# Patient Record
Sex: Female | Born: 1945 | Race: White | Hispanic: No
Health system: Midwestern US, Academic
[De-identification: ages and names within clinical notes are randomized; demographics above are authoritative.]

---

## 2017-05-18 MED ORDER — REGADENOSON 0.4 MG/5 ML IV SYRG
.4 mg | Freq: Once | INTRAVENOUS | 0 refills | Status: CP
Start: 2017-05-18 — End: ?

## 2017-05-18 MED ORDER — AMINOPHYLLINE 500 MG/20 ML IV SOLN
50 mg | INTRAVENOUS | 0 refills | Status: AC | PRN
Start: 2017-05-18 — End: ?

## 2017-05-18 MED ORDER — ALBUTEROL SULFATE 90 MCG/ACTUATION IN HFAA
2 | RESPIRATORY_TRACT | 0 refills | Status: DC | PRN
Start: 2017-05-18 — End: 2017-05-23

## 2017-05-18 MED ORDER — SODIUM CHLORIDE 0.9 % IV SOLP
250 mL | INTRAVENOUS | 0 refills | Status: AC | PRN
Start: 2017-05-18 — End: ?

## 2017-05-18 MED ORDER — NITROGLYCERIN 0.4 MG SL SUBL
.4 mg | SUBLINGUAL | 0 refills | Status: AC | PRN
Start: 2017-05-18 — End: ?

## 2017-05-20 ENCOUNTER — Encounter: Admit: 2017-05-20 | Discharge: 2017-05-20 | Payer: MEDICARE

## 2017-05-20 NOTE — Telephone Encounter
-----   Message from Vanice Sarah, MD sent at 05/19/2017  3:55 PM CDT -----  [facility], can you please let Gianne know that this looks good for her?  Thanks.    Cc:  Dr. Burnadette Pop

## 2017-06-13 LAB — CREATINE KINASE-CPK: Lab: 122 — ABNORMAL LOW (ref 23–31)

## 2017-06-13 LAB — COMPREHENSIVE METABOLIC PANEL
Lab: 0.5
Lab: 1
Lab: 102 — ABNORMAL HIGH (ref 9.8–20.1)
Lab: 139
Lab: 151 — ABNORMAL HIGH (ref 83–110)
Lab: 21 — ABNORMAL HIGH (ref 0–14)
Lab: 3.3 — ABNORMAL LOW (ref 3.4–4.8)
Lab: 36 — ABNORMAL HIGH (ref 5–34)
Lab: 38
Lab: 53
Lab: 61
Lab: 68
Lab: 7.4 — ABNORMAL HIGH (ref 0–14)
Lab: 9.8

## 2017-06-13 LAB — TROPONIN-I

## 2017-06-13 LAB — MYOGLOBIN-CHEM: Lab: 100 — ABNORMAL HIGH (ref 9.8–20.1)

## 2017-06-14 LAB — THYROID STIMULATING HORMONE-TSH: Lab: 2.4

## 2017-07-01 ENCOUNTER — Encounter: Admit: 2017-07-01 | Discharge: 2017-07-01 | Payer: MEDICARE

## 2017-07-14 ENCOUNTER — Encounter: Admit: 2017-07-14 | Discharge: 2017-07-14 | Payer: MEDICARE

## 2017-07-14 ENCOUNTER — Ambulatory Visit: Admit: 2017-07-14 | Discharge: 2017-07-15 | Payer: MEDICARE

## 2017-07-14 DIAGNOSIS — M810 Age-related osteoporosis without current pathological fracture: ICD-10-CM

## 2017-07-14 DIAGNOSIS — E785 Hyperlipidemia, unspecified: ICD-10-CM

## 2017-07-14 DIAGNOSIS — D899 Disorder involving the immune mechanism, unspecified: ICD-10-CM

## 2017-07-14 DIAGNOSIS — K219 Gastro-esophageal reflux disease without esophagitis: ICD-10-CM

## 2017-07-14 DIAGNOSIS — I280 Arteriovenous fistula of pulmonary vessels: ICD-10-CM

## 2017-07-14 DIAGNOSIS — M069 Rheumatoid arthritis, unspecified: ICD-10-CM

## 2017-07-14 DIAGNOSIS — M1990 Unspecified osteoarthritis, unspecified site: ICD-10-CM

## 2017-07-14 DIAGNOSIS — E119 Type 2 diabetes mellitus without complications: ICD-10-CM

## 2017-07-14 DIAGNOSIS — G5600 Carpal tunnel syndrome, unspecified upper limb: ICD-10-CM

## 2017-07-14 DIAGNOSIS — I480 Paroxysmal atrial fibrillation: Principal | ICD-10-CM

## 2017-07-14 DIAGNOSIS — Z01810 Encounter for preprocedural cardiovascular examination: ICD-10-CM

## 2017-07-14 DIAGNOSIS — IMO0002 Ulcer: ICD-10-CM

## 2017-07-14 MED ORDER — PROPRANOLOL 40 MG PO TAB
ORAL_TABLET | 2 refills | Status: AC
Start: 2017-07-14 — End: 2018-11-23

## 2017-07-14 NOTE — Assessment & Plan Note
She is not particularly interested in taking a daily medication to try to prevent episodes of atrial fibrillation, which seems reasonable given the infrequency of her episodes.  I gave her a prescription for propranolol to use on a as needed basis because she does seem to recognize the atrial fibrillation when she has.  She discontinued oral anticoagulation and has had no bleeding complications.

## 2017-07-14 NOTE — Progress Notes
Date of Service: 07/14/2017    Madison Johnston is a 71 y.o. female.       HPI     Madison Johnston was in the [place] office today for follow-up regarding paroxysmal atrial fibrillation.  Although the symptoms she has while in atrial fibrillation are atypical she does seem to be able to reliably determine when she is in the dysrhythmia.    She went to the [facility] emergency department about a month ago with an episode of atrial fibrillation and was monitored overnight.  She had presented with a sense of her heart beating fast that was confirmed with her home blood pressure monitor.  They gave her IV diltiazem in the emergency department and she converted spontaneously to sinus rhythm shortly afterwards.    She never feels particularly bad while in atrial fibrillation.  She describes the symptoms as feeling different in my head or tingly.  She does seem to be able to tell reliably that she is out of rhythm and her home blood pressure monitor confirms a fast heart rate when this happens.    She has had no bleeding complications related to oral anticoagulation.  She denies any chest discomfort or breathlessness.           Vitals:    07/14/17 0856   BP: 128/76   Pulse: 70   Height: 1.524 m (5')     There is no height or weight on file to calculate BMI.     Past Medical History  Patient Active Problem List    Diagnosis Date Noted   ??? Gout flare 09/01/2011     Priority: High   ??? Screening for cardiovascular condition 01/14/2017   ??? Paroxysmal A-fib [facility]) 11/07/2016     11/01/16 - AF presentation to [facility] ED.  Converted spontaneously to SR on IV diltiazem.  Eliquis started.  11/02/16 - Echo [facility]):  EF 65%.  Mildly thickened mitral leaflets.  Mild MAC.  Mean AV gradient 8 mmHg.  Otherwise normal study.     ??? HTN (hypertension) 07/08/2015   ??? DM (diabetes mellitus screen) 07/08/2015   ??? 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 Status post right knee replacement 07/22/2014 ??? Pre-operative cardiovascular examination 07/19/2014   ??? 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 Fracture of lateral condyle of femur (HCC) 08/22/2013   ??? Personal history of colonic polyps 07/12/2012     Colonoscopy 03/2012: TI normal. Rectal stump normal. Mild diverticulosis in the descending colon. Cecal polyp (TA) removed. Three sessile polyps in the descending colon removed (TAs) .      ??? Diverticulosis of colon 09/21/2011   ??? Diverticulitis of colon with perforation 09/21/2011     S/P Sigmoid colectomy and Hartman's in 07/2011.       ??? Postoperative wound infection 09/21/2011   ??? S/P colon resection 09/21/2011   ??? Morbid obesity (HCC) 08/17/2011   ??? Urinary retention 08/17/2011   ??? Anxiety 08/17/2011   ??? Hypotension 08/17/2011   ??? Hypoalbuminemia 08/16/2011   ??? Hypocalcemia 08/16/2011   ??? Gait abnormality 08/13/2011   ??? Debility 08/13/2011   ??? Anemia 08/13/2011   ??? Clostridium difficile infection 08/13/2011   ??? Post-operative pain 08/13/2011   ??? Sacral pressure ulcer 08/13/2011   ??? Hypokalemia 08/13/2011   ??? AKI (acute kidney injury) (HCC) 07/27/2011   ??? Hypoxia 07/27/2011   ??? Constipation 07/14/2011     Change in bowel habits  and new constipation since Feb 2012. Two admissions to [facility] in 02 and 03/12 for colitis found on CT ad and treated with ABx. Admitted to [facility] for large bowel obstruction in 07/2011, had surgery for sigmoid inflammatory mass     ??? Rheumatoid arthritis 07/14/2011   ??? GERD (gastroesophageal reflux disease) 07/14/2011     Currently controlled     ??? Osteoporosis 07/14/2011         Review of Systems   Constitution: Negative.   HENT: Negative.    Eyes: Negative.    Cardiovascular: Positive for dyspnea on exertion.   Respiratory: Positive for snoring.    Endocrine: Negative.    Hematologic/Lymphatic: Negative.    Skin: Positive for dry skin.   Musculoskeletal: Positive for arthritis.   Gastrointestinal: Negative. Genitourinary: Negative.    Neurological: Negative.    Psychiatric/Behavioral: Negative.    Allergic/Immunologic: Positive for environmental allergies.       Physical Exam    Physical Exam   General Appearance: no distress   Skin: warm, no ulcers or xanthomas   Digits and Nails: no cyanosis or clubbing   Eyes: conjunctivae and lids normal, pupils are equal and round   Teeth/Gums/Palate: dentition unremarkable, no lesions   Lips & Oral Mucosa: no pallor or cyanosis   Neck Veins: normal JVP , neck veins are not distended   Thyroid: no nodules, masses, tenderness or enlargement   Chest Inspection: chest is normal in appearance   Respiratory Effort: breathing comfortably, no respiratory distress   Auscultation/Percussion: lungs clear to auscultation, no rales or rhonchi, no wheezing   PMI: PMI not enlarged or displaced   Cardiac Rhythm: regular rhythm and normal rate   Cardiac Auscultation: S1, S2 normal, no rub, no gallop   Murmurs: no murmur   Peripheral Circulation: normal peripheral circulation   Carotid Arteries: normal carotid upstroke bilaterally, no bruits   Radial Arteries: normal symmetric radial pulses   Abdominal Aorta: no abdominal aortic bruit   Pedal Pulses: normal symmetric pedal pulses   Lower Extremity Edema: no lower extremity edema   Abdominal Exam: soft, non-tender, no masses, bowel sounds normal   Liver & Spleen: no organomegaly   Gait & Station: walks with a walker  Muscle Strength: normal muscle tone   Orientation: oriented to time, place and person   Affect & Mood: appropriate and sustained affect   Language and Memory: patient responsive and seems to comprehend information   Neurologic Exam: neurological assessment grossly intact   Other: moves all extremities      Problems Addressed Today  Encounter Diagnoses   Name Primary?   ??? Paroxysmal A-fib (HCC)    ??? Pre-operative cardiovascular examination        Assessment and Plan       Paroxysmal A-fib (HCC) She is not particularly interested in taking a daily medication to try to prevent episodes of atrial fibrillation, which seems reasonable given the infrequency of her episodes.  I gave her a prescription for propranolol to use on a as needed basis because she does seem to recognize the atrial fibrillation when she has.  She discontinued oral anticoagulation and has had no bleeding complications.    Pre-operative cardiovascular examination  Her stress test in late May showed normal LV systolic function and no evidence of cardiac ischemia.      Current Medications (including today's revisions)  ??? allopurinol (ZYLOPRIM) 100 mg tablet Take 1 Tab by mouth daily. (Patient taking differently: Take 100 mg by mouth  at bedtime daily.)   ??? apixaban (ELIQUIS) 5 mg tablet Take 5 mg by mouth twice daily.   ??? calcium carbonate (OS-CAL) 1250 mg tablet Take 1,250 mg by mouth twice daily.   ??? cephalexin (KEFLEX) 500 mg capsule Take 4 capsules by mouth one hour before dental procedure. Use remaining capsules for future dental procedures.   ??? cetirizine (ZYRTEC) 10 mg tablet Take 1 Tab by mouth daily as needed for Allergy symptoms.   ??? cholecalciferol (VITAMIN D-3) 1,000 units tablet Take 2,000 Units by mouth daily.   ??? esomeprazole DR(+) (NEXIUM) 40 mg capsule Take 40 mg by mouth every morning.   ??? fish oil /omega-3 fatty acids (SEA-OMEGA) 340/1000 mg capsule Take 2 Caps by mouth daily.   ??? fluticasone (FLONASE) 50 mcg/actuation nasal spray Apply 2 Sprays to each nostril as directed daily as needed.   ??? folic acid (FOLVITE) 1 mg tablet Take 1 Tab by mouth daily. (Patient taking differently: Take 2 mg by mouth daily.)   ??? leflunomide (ARAVA) 20 mg tablet Take 20 mg by mouth daily.   ??? losartan (COZAAR) 50 mg tablet Take 1 tablet by mouth daily.   ??? metFORMIN (GLUCOPHAGE) 500 mg tablet Take 1 Tab by mouth twice daily with meals. (Patient taking differently: Take 500 mg by mouth daily with breakfast.) ??? metoprolol XL (TOPROL XL) 50 mg tablet Take 1 Tab by mouth twice daily. (Patient taking differently: Take 100 mg by mouth daily.)   ??? predniSONE (DELTASONE) 2.5 mg tablet Take 1 Tab by mouth daily.   ??? propranolol (INDERAL) 40 mg tablet Take 1 for atrial fibrillation; may repeat in 15 minutes if necessary   ??? senna (SENOKOT) 8.6 mg tablet Take 3 tablets by mouth at bedtime daily.   ??? simvastatin (ZOCOR) 80 mg tablet Take 1 Tab by mouth at bedtime daily.   ??? triamterene-hydrochlorothiazide (MAXZIDE) 75-50 mg tablet Take 1 tablet by mouth daily.   ??? vitamins, multiple tablet Take 1 Tab by mouth daily.

## 2017-07-14 NOTE — Assessment & Plan Note
Her stress test in late May showed normal LV systolic function and no evidence of cardiac ischemia.

## 2017-07-19 ENCOUNTER — Encounter: Admit: 2017-07-19 | Discharge: 2017-07-19 | Payer: MEDICARE

## 2018-01-31 ENCOUNTER — Encounter: Admit: 2018-01-31 | Discharge: 2018-01-31 | Payer: MEDICARE

## 2018-01-31 MED ORDER — LOSARTAN 50 MG PO TAB
ORAL_TABLET | Freq: Every day | ORAL | 3 refills | 90.00000 days | Status: AC
Start: 2018-01-31 — End: 2019-02-06

## 2018-02-27 ENCOUNTER — Encounter: Admit: 2018-02-27 | Discharge: 2018-02-27 | Payer: MEDICARE

## 2018-03-23 ENCOUNTER — Encounter: Admit: 2018-03-23 | Discharge: 2018-03-23 | Payer: MEDICARE

## 2018-03-23 DIAGNOSIS — E785 Hyperlipidemia, unspecified: ICD-10-CM

## 2018-03-23 DIAGNOSIS — M1990 Unspecified osteoarthritis, unspecified site: Secondary | ICD-10-CM

## 2018-03-23 DIAGNOSIS — IMO0002 Ulcer: ICD-10-CM

## 2018-03-23 DIAGNOSIS — M069 Rheumatoid arthritis, unspecified: ICD-10-CM

## 2018-03-23 DIAGNOSIS — I280 Arteriovenous fistula of pulmonary vessels: ICD-10-CM

## 2018-03-23 DIAGNOSIS — M810 Age-related osteoporosis without current pathological fracture: ICD-10-CM

## 2018-03-23 DIAGNOSIS — K219 Gastro-esophageal reflux disease without esophagitis: ICD-10-CM

## 2018-03-23 DIAGNOSIS — G5600 Carpal tunnel syndrome, unspecified upper limb: ICD-10-CM

## 2018-03-23 DIAGNOSIS — E119 Type 2 diabetes mellitus without complications: ICD-10-CM

## 2018-03-23 DIAGNOSIS — D899 Disorder involving the immune mechanism, unspecified: ICD-10-CM

## 2018-07-31 LAB — HEMOGLOBIN A1C: Lab: 6.1

## 2018-07-31 LAB — COMPREHENSIVE METABOLIC PANEL: Lab: 139

## 2018-07-31 LAB — LIPID PROFILE
Lab: 179
Lab: 425 — ABNORMAL HIGH (ref 30–200)

## 2018-07-31 LAB — CBC: Lab: 7.7

## 2018-08-10 ENCOUNTER — Encounter: Admit: 2018-08-10 | Discharge: 2018-08-10 | Payer: MEDICARE

## 2018-08-18 ENCOUNTER — Encounter: Admit: 2018-08-18 | Discharge: 2018-08-18 | Payer: MEDICARE

## 2018-08-18 DIAGNOSIS — K219 Gastro-esophageal reflux disease without esophagitis: ICD-10-CM

## 2018-08-18 DIAGNOSIS — M810 Age-related osteoporosis without current pathological fracture: ICD-10-CM

## 2018-08-18 DIAGNOSIS — IMO0002 Ulcer: ICD-10-CM

## 2018-08-18 DIAGNOSIS — D899 Disorder involving the immune mechanism, unspecified: ICD-10-CM

## 2018-08-18 DIAGNOSIS — I280 Arteriovenous fistula of pulmonary vessels: ICD-10-CM

## 2018-08-18 DIAGNOSIS — G5600 Carpal tunnel syndrome, unspecified upper limb: ICD-10-CM

## 2018-08-18 DIAGNOSIS — K435 Parastomal hernia without obstruction or  gangrene: ICD-10-CM

## 2018-08-18 DIAGNOSIS — M069 Rheumatoid arthritis, unspecified: ICD-10-CM

## 2018-08-18 DIAGNOSIS — E785 Hyperlipidemia, unspecified: ICD-10-CM

## 2018-08-18 DIAGNOSIS — R109 Unspecified abdominal pain: Principal | ICD-10-CM

## 2018-08-18 DIAGNOSIS — M1990 Unspecified osteoarthritis, unspecified site: Secondary | ICD-10-CM

## 2018-08-18 DIAGNOSIS — I480 Paroxysmal atrial fibrillation: Secondary | ICD-10-CM

## 2018-08-18 DIAGNOSIS — E119 Type 2 diabetes mellitus without complications: ICD-10-CM

## 2018-08-18 DIAGNOSIS — K5730 Diverticulosis of large intestine without perforation or abscess without bleeding: ICD-10-CM

## 2018-08-29 ENCOUNTER — Encounter: Admit: 2018-08-29 | Discharge: 2018-08-30 | Payer: MEDICARE

## 2018-09-08 ENCOUNTER — Encounter: Admit: 2018-09-08 | Discharge: 2018-09-08 | Payer: MEDICARE

## 2018-09-08 DIAGNOSIS — I480 Paroxysmal atrial fibrillation: Principal | ICD-10-CM

## 2018-09-08 DIAGNOSIS — G5600 Carpal tunnel syndrome, unspecified upper limb: ICD-10-CM

## 2018-09-08 DIAGNOSIS — M810 Age-related osteoporosis without current pathological fracture: ICD-10-CM

## 2018-09-08 DIAGNOSIS — E785 Hyperlipidemia, unspecified: ICD-10-CM

## 2018-09-08 DIAGNOSIS — M069 Rheumatoid arthritis, unspecified: ICD-10-CM

## 2018-09-08 DIAGNOSIS — IMO0002 Ulcer: ICD-10-CM

## 2018-09-08 DIAGNOSIS — E119 Type 2 diabetes mellitus without complications: ICD-10-CM

## 2018-09-08 DIAGNOSIS — K435 Parastomal hernia without obstruction or  gangrene: ICD-10-CM

## 2018-09-08 DIAGNOSIS — D899 Disorder involving the immune mechanism, unspecified: ICD-10-CM

## 2018-09-08 DIAGNOSIS — M1990 Unspecified osteoarthritis, unspecified site: Secondary | ICD-10-CM

## 2018-09-08 DIAGNOSIS — I280 Arteriovenous fistula of pulmonary vessels: ICD-10-CM

## 2018-09-08 DIAGNOSIS — K219 Gastro-esophageal reflux disease without esophagitis: ICD-10-CM

## 2018-09-10 ENCOUNTER — Encounter: Admit: 2018-09-10 | Discharge: 2018-09-10 | Payer: MEDICARE

## 2018-11-07 ENCOUNTER — Encounter: Admit: 2018-11-07 | Discharge: 2018-11-07 | Payer: MEDICARE

## 2018-11-23 ENCOUNTER — Ambulatory Visit: Admit: 2018-11-23 | Discharge: 2018-11-24 | Payer: MEDICARE

## 2018-11-23 ENCOUNTER — Encounter: Admit: 2018-11-23 | Discharge: 2018-11-23 | Payer: MEDICARE

## 2018-11-23 DIAGNOSIS — D899 Disorder involving the immune mechanism, unspecified: ICD-10-CM

## 2018-11-23 DIAGNOSIS — E119 Type 2 diabetes mellitus without complications: ICD-10-CM

## 2018-11-23 DIAGNOSIS — M1990 Unspecified osteoarthritis, unspecified site: Secondary | ICD-10-CM

## 2018-11-23 DIAGNOSIS — E785 Hyperlipidemia, unspecified: ICD-10-CM

## 2018-11-23 DIAGNOSIS — M069 Rheumatoid arthritis, unspecified: ICD-10-CM

## 2018-11-23 DIAGNOSIS — I280 Arteriovenous fistula of pulmonary vessels: ICD-10-CM

## 2018-11-23 DIAGNOSIS — IMO0002 Ulcer: ICD-10-CM

## 2018-11-23 DIAGNOSIS — I480 Paroxysmal atrial fibrillation: Principal | ICD-10-CM

## 2018-11-23 DIAGNOSIS — K219 Gastro-esophageal reflux disease without esophagitis: ICD-10-CM

## 2018-11-23 DIAGNOSIS — G5600 Carpal tunnel syndrome, unspecified upper limb: ICD-10-CM

## 2018-11-23 DIAGNOSIS — I10 Essential (primary) hypertension: ICD-10-CM

## 2018-11-23 DIAGNOSIS — M810 Age-related osteoporosis without current pathological fracture: ICD-10-CM

## 2018-11-23 MED ORDER — PROPRANOLOL 40 MG PO TAB
ORAL_TABLET | 3 refills | Status: AC
Start: 2018-11-23 — End: 2019-12-18

## 2019-02-06 ENCOUNTER — Encounter: Admit: 2019-02-06 | Discharge: 2019-02-06 | Payer: MEDICARE

## 2019-02-06 MED ORDER — LOSARTAN 50 MG PO TAB
ORAL_TABLET | Freq: Every day | ORAL | 3 refills | 30.00000 days | Status: AC
Start: 2019-02-06 — End: 2019-11-20

## 2019-05-24 ENCOUNTER — Encounter: Admit: 2019-05-24 | Discharge: 2019-05-24

## 2019-05-24 NOTE — Telephone Encounter
05/24/2019 1:31 PM I called Nash Dimmer back at [phone] and LM that Dr Barry Dienes was fine with Dr greens starting Okey Regal on HCTZ 12.13mh daily.  Med list updated. Morene Crocker, RN        Vanice Sarah, MD  You 2 hours ago (10:48 AM)      Sure, thanks.    Routing comment

## 2019-05-24 NOTE — Telephone Encounter
05/24/2019 10:42 AM   I received a call from Dr Thomasene Lot nurse Nash Dimmer at the [place] clinic.  Dr Chilton Si saw Madison Johnston today and she had some pedal edema, her BP was 136/78.  Dr Chilton Si would like to know if Dr Orvan July would be OK with adding HCTZ 12.5mg  daily. Morene Crocker, RN

## 2019-11-20 ENCOUNTER — Encounter: Admit: 2019-11-20 | Discharge: 2019-11-20 | Payer: MEDICARE

## 2019-11-20 MED ORDER — LOSARTAN 50 MG PO TAB
50 mg | ORAL_TABLET | Freq: Every day | ORAL | 3 refills | 30.00000 days | Status: DC
Start: 2019-11-20 — End: 2020-01-10

## 2019-11-28 ENCOUNTER — Ambulatory Visit: Admit: 2019-11-28 | Discharge: 2019-11-28 | Payer: MEDICARE

## 2019-11-28 ENCOUNTER — Encounter: Admit: 2019-11-28 | Discharge: 2019-11-28 | Payer: MEDICARE

## 2019-11-28 DIAGNOSIS — I480 Paroxysmal atrial fibrillation: Secondary | ICD-10-CM

## 2019-11-28 DIAGNOSIS — M1990 Unspecified osteoarthritis, unspecified site: Secondary | ICD-10-CM

## 2019-11-28 DIAGNOSIS — I10 Essential (primary) hypertension: Secondary | ICD-10-CM

## 2019-11-28 DIAGNOSIS — M810 Age-related osteoporosis without current pathological fracture: Secondary | ICD-10-CM

## 2019-11-28 DIAGNOSIS — D899 Disorder involving the immune mechanism, unspecified: Secondary | ICD-10-CM

## 2019-11-28 DIAGNOSIS — E785 Hyperlipidemia, unspecified: Secondary | ICD-10-CM

## 2019-11-28 DIAGNOSIS — M069 Rheumatoid arthritis, unspecified: Secondary | ICD-10-CM

## 2019-11-28 DIAGNOSIS — G5600 Carpal tunnel syndrome, unspecified upper limb: Secondary | ICD-10-CM

## 2019-11-28 DIAGNOSIS — K219 Gastro-esophageal reflux disease without esophagitis: Secondary | ICD-10-CM

## 2019-11-28 DIAGNOSIS — G4733 Obstructive sleep apnea (adult) (pediatric): Secondary | ICD-10-CM

## 2019-11-28 DIAGNOSIS — IMO0002 Ulcer: Secondary | ICD-10-CM

## 2019-11-28 DIAGNOSIS — E119 Type 2 diabetes mellitus without complications: Secondary | ICD-10-CM

## 2019-11-28 DIAGNOSIS — I280 Arteriovenous fistula of pulmonary vessels: Secondary | ICD-10-CM

## 2019-11-28 DIAGNOSIS — I5031 Acute diastolic (congestive) heart failure: Secondary | ICD-10-CM

## 2019-11-28 NOTE — Assessment & Plan Note
I asked her to check her blood pressure at home regularly.  The goal is an average of 130/80 or less.

## 2019-11-28 NOTE — Assessment & Plan Note
I plan to arrange for a 1 week Zio patch monitor to be applied in our [place] clinic.  Part of managing her diastolic heart failure exacerbation will involve assessment of her atrial fibrillation burden.

## 2019-11-28 NOTE — Progress Notes
Date of Service: 11/28/2019    Madison Johnston is a 73 y.o. female.       HPI     Madison Johnston was in the [place] office today with her daughter.  She is here for an urgent work in visit regarding lower extremity edema.    This is a new problem for her.  She does have a history of paroxysmal atrial fibrillation over the past couple of years and admits that she no longer thinks she can tell when she is out of rhythm.  She says that Dr. Chilton Si thought she was in atrial fibrillation when she was in the office recently but she otherwise has no idea about the frequency of dysrhythmia.  Madison Johnston admits that she was not taking her Eliquis regularly because of cost but over the past month she has been taking it on a regular basis.    She has pretty massive lower extremity edema and thinks that furosemide was prescribed along with potassium but does not think it made much of a difference.  Madison Johnston has not been particularly short of breath but it is more difficult for her to walk because of the degree of edema in her legs.  She denies any problems with syncope or near syncope nor has she been having any chest discomfort.    Her daughter indicates that the patient has terrible episodes of apnea and that she thinks she needs to be evaluated for sleep disorder.         Vitals:    11/28/19 1620   BP: (!) 140/90   BP Source: Arm, Left Upper   Pulse: 79   SpO2: 96%   Weight: 94.3 kg (208 lb)   Height: 1.524 m (5')   PainSc: Zero     Body mass index is 40.62 kg/m?Marland Kitchen     Past Medical History  Patient Active Problem List    Diagnosis Date Noted   ? Gout flare 09/01/2011     Priority: High   ? OSA (obstructive sleep apnea) 11/28/2019   ? Acute heart failure with preserved ejection fraction (HCC) 11/28/2019   ? 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 Screening for cardiovascular condition 01/14/2017   ? Paroxysmal A-fib [facility]) 11/07/2016 11/01/16 - AF presentation to [facility] ED.  Converted spontaneously to SR on IV diltiazem.  Eliquis started.  11/02/16 - Echo [facility]):  EF 65%.  Mildly thickened mitral leaflets.  Mild MAC.  Mean AV gradient 8 mmHg.  Otherwise normal study.     ? HTN (hypertension) 07/08/2015   ? DM (diabetes mellitus screen) 07/08/2015   ? 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 Pre-operative cardiovascular examination 07/19/2014   ? 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 Fracture of lateral condyle of femur (HCC) 08/22/2013   ? Personal history of colonic polyps 07/12/2012     Colonoscopy 03/2012: TI normal. Rectal stump normal. Mild diverticulosis in the descending colon. Cecal polyp (TA) removed. Three sessile polyps in the descending colon removed (TAs) .      ? Diverticulosis of colon 09/21/2011   ? Diverticulitis of colon with perforation 09/21/2011     S/P Sigmoid colectomy and Hartman's in 07/2011.       ? Postoperative wound infection 09/21/2011   ? S/P colon resection 09/21/2011   ? Morbid obesity (HCC) 08/17/2011   ? Urinary retention 08/17/2011   ?  Anxiety 08/17/2011   ? Hypotension 08/17/2011   ? Hypoalbuminemia 08/16/2011   ? Hypocalcemia 08/16/2011   ? Gait abnormality 08/13/2011   ? Debility 08/13/2011   ? Anemia 08/13/2011   ? Clostridium difficile infection 08/13/2011   ? Post-operative pain 08/13/2011   ? Sacral pressure ulcer 08/13/2011   ? Hypokalemia 08/13/2011   ? AKI (acute kidney injury) (HCC) 07/27/2011   ? Hypoxia 07/27/2011   ? Constipation 07/14/2011     Change in bowel habits and new constipation since Feb 2012. Two admissions to [facility] in 02 and 03/12 for colitis found on CT ad and treated with ABx. Admitted to [facility] for large bowel obstruction in 07/2011, had surgery for sigmoid inflammatory mass     ? Rheumatoid arthritis 07/14/2011 ? GERD (gastroesophageal reflux disease) 07/14/2011     Currently controlled     ? Osteoporosis 07/14/2011         Review of Systems   Constitution: Positive for malaise/fatigue.   HENT: Negative.    Eyes: Negative.    Cardiovascular: Positive for leg swelling (BOTH ).   Respiratory: Negative.    Endocrine: Negative.    Skin: Negative.    Musculoskeletal: Negative.    Gastrointestinal: Negative.    Genitourinary: Negative.    Neurological: Negative.    Psychiatric/Behavioral: Negative.        Physical Exam    Physical Exam   General Appearance: no distress   Skin: warm, no ulcers or xanthomas   Digits and Nails: no cyanosis or clubbing   Eyes: conjunctivae and lids normal, pupils are equal and round   Teeth/Gums/Palate: dentition unremarkable, no lesions   Lips & Oral Mucosa: no pallor or cyanosis   Neck Veins: normal JVP , neck veins are not distended   Thyroid: no nodules, masses, tenderness or enlargement   Chest Inspection: chest is normal in appearance   Respiratory Effort: breathing comfortably, no respiratory distress   Auscultation/Percussion: lungs clear to auscultation, no rales or rhonchi, no wheezing   PMI: PMI not enlarged or displaced   Cardiac Rhythm: regular rhythm and normal rate   Cardiac Auscultation: S1, S2 normal, no rub, no gallop   Murmurs: no murmur   Peripheral Circulation: normal peripheral circulation   Carotid Arteries: normal carotid upstroke bilaterally, no bruits   Radial Arteries: normal symmetric radial pulses   Abdominal Aorta: no abdominal aortic bruit   Pedal Pulses: normal symmetric pedal pulses   Lower Extremity Edema: 3+ bilateral lower extremity edema   Abdominal Exam: soft, non-tender, no masses, bowel sounds normal   Liver & Spleen: no organomegaly   Gait & Station: in a wheelchair  Muscle Strength: normal muscle tone   Orientation: oriented to time, place and person   Affect & Mood: appropriate and sustained affect Language and Memory: patient responsive and seems to comprehend information   Neurologic Exam: neurological assessment grossly intact   Other: moves all extremities        Problems Addressed Today  Encounter Diagnoses   Name Primary?   ? Paroxysmal A-fib (HCC)    ? Essential hypertension    ? OSA (obstructive sleep apnea)    ? Acute heart failure with preserved ejection fraction (HCC)        Assessment and Plan       Paroxysmal A-fib (HCC)  I plan to arrange for a 1 week Zio patch monitor to be applied in our [place] clinic.  Part of managing her diastolic heart failure exacerbation will  involve assessment of her atrial fibrillation burden.    HTN (hypertension)  I asked her to check her blood pressure at home regularly.  The goal is an average of 130/80 or less.    OSA (obstructive sleep apnea)  She has an old diagnosis of sleep apnea but has not used her CPAP machine for about 9 years.  Her daughter says that she has terrible apnea and certainly this would be contributing to both atrial fibrillation and heart failure.  I would like to get her scheduled for a sleep study at [facility].    Acute heart failure with preserved ejection fraction [facility])  It looks to me like she is having an acute exacerbation of diastolic heart failure.  I like to make arrangements for her to get IV furosemide at [facility].  We will look for the lab work that Dr. Chilton Si had done a couple of weeks ago.  We'll schedule an echocardiogram at [facility].  She will probably need to be on a diuretic chronically.  We do need to see whether sleep apnea and increased AF burden may be contributing to her propensity for diastolic heart failure.      Current Medications (including today's revisions)  ? allopurinol (ZYLOPRIM) 100 mg tablet Take 1 Tab by mouth daily. (Patient taking differently: Take 100 mg by mouth at bedtime daily.)   ? apixaban (ELIQUIS) 5 mg tablet Take 5 mg by mouth twice daily. ? calcium carbonate (OS-CAL) 1250 mg tablet Take 1,250 mg by mouth twice daily.   ? cephalexin (KEFLEX) 500 mg capsule Take 4 capsules by mouth one hour before dental procedure. Use remaining capsules for future dental procedures.   ? cetirizine (ZYRTEC) 10 mg tablet Take 1 Tab by mouth daily as needed for Allergy symptoms.   ? cholecalciferol (VITAMIN D-3) 1,000 units tablet Take 2,000 Units by mouth daily.   ? esomeprazole DR(+) (NEXIUM) 40 mg capsule Take 40 mg by mouth every morning.   ? fish oil /omega-3 fatty acids (SEA-OMEGA) 340/1000 mg capsule Take 2 Caps by mouth daily.   ? fluticasone (FLONASE) 50 mcg/actuation nasal spray Apply 2 Sprays to each nostril as directed daily as needed.   ? folic acid (FOLVITE) 1 mg tablet Take 1 Tab by mouth daily. (Patient taking differently: Take 2 mg by mouth daily.)   ? hydroCHLOROthiazide (HYDRODIURIL) 12.5 mg tablet Take 12.5 mg by mouth every morning.   ? leflunomide (ARAVA) 20 mg tablet Take 20 mg by mouth daily.   ? losartan (COZAAR) 50 mg tablet Take one tablet by mouth daily.   ? metFORMIN (GLUCOPHAGE) 500 mg tablet Take 1 Tab by mouth twice daily with meals. (Patient taking differently: Take 500 mg by mouth daily with breakfast.)   ? metoprolol XL (TOPROL XL) 50 mg tablet Take 1 Tab by mouth twice daily. (Patient taking differently: Take 100 mg by mouth daily.)   ? propranolol (INDERAL) 40 mg tablet Take 1 for atrial fibrillation; may repeat in 15 minutes if necessary   ? senna (SENOKOT) 8.6 mg tablet Take 3 tablets by mouth at bedtime daily.   ? simvastatin (ZOCOR) 80 mg tablet Take 1 Tab by mouth at bedtime daily.   ? triamterene-hydrochlorothiazide (MAXZIDE) 75-50 mg tablet Take 1 tablet by mouth daily.   ? vitamins, multiple tablet Take 1 Tab by mouth daily.

## 2019-11-28 NOTE — Assessment & Plan Note
She has an old diagnosis of sleep apnea but has not used her CPAP machine for about 9 years.  Her daughter says that she has terrible apnea and certainly this would be contributing to both atrial fibrillation and heart failure.  I would like to get her scheduled for a sleep study at [facility].

## 2019-11-29 ENCOUNTER — Encounter: Admit: 2019-11-29 | Discharge: 2019-11-29 | Payer: MEDICARE

## 2019-11-29 MED ORDER — FUROSEMIDE IVPB
40 mg | INTRAVENOUS | 0 refills | 90.00000 days | Status: DC
Start: 2019-11-29 — End: 2019-11-30

## 2019-11-30 ENCOUNTER — Encounter: Admit: 2019-11-30 | Discharge: 2019-11-30 | Payer: MEDICARE

## 2019-11-30 DIAGNOSIS — I5031 Acute diastolic (congestive) heart failure: Secondary | ICD-10-CM

## 2019-11-30 LAB — BASIC METABOLIC PANEL
Lab: 1
Lab: 140
Lab: 16 — ABNORMAL HIGH (ref 0–14)
Lab: 24
Lab: 3.8

## 2019-11-30 MED ORDER — FUROSEMIDE 10 MG/ML IJ SOLN
40 mg | Freq: Once | INTRAVENOUS | 2 refills | 90.00000 days | Status: AC
Start: 2019-11-30 — End: ?

## 2019-12-07 ENCOUNTER — Encounter: Admit: 2019-12-07 | Discharge: 2019-12-07 | Payer: MEDICARE

## 2019-12-07 DIAGNOSIS — I5031 Acute diastolic (congestive) heart failure: Secondary | ICD-10-CM

## 2019-12-07 DIAGNOSIS — I4891 Unspecified atrial fibrillation: Secondary | ICD-10-CM

## 2019-12-07 DIAGNOSIS — I480 Paroxysmal atrial fibrillation: Secondary | ICD-10-CM

## 2019-12-07 NOTE — Telephone Encounter
Called and discussed results with patient.  No questions at this time.  Pt will callback with any questions, concerns or problems.  Pt will have labs rechecked on Monday at [facility]  Lab order faxed.    Pt states that she hasn't noticed any significant improvement of her symptoms after having her lasix infusions.  She states that she is not quite as short of breath, but states that she hasn't really been doing anything to make that worse.  She states that her lower extremities are still swollen and her weight has not changed.  She is taking her medications as prescribed.      Will route to Dr. Ricard Dillon for any additional recommendations.

## 2019-12-07 NOTE — Telephone Encounter
-----   Message from Michiel Cowboy, MD sent at 12/03/2019  4:38 PM CST -----  Maybe just BMP next Monday at this point, thanks.  ----- Message -----  From: Baldwin Crown, RN  Sent: 12/03/2019   3:16 PM CST  To: Michiel Cowboy, MD    Dr. Ricard Dillon,    Richardson Landry and Threasa Beards scheduled Arbie Cookey for additional lasix infusions, she had one on Friday, and is scheduled for the final dose today.  Do you want to get any additional labs?  Please let me know and we will get them set up.    Thanks for your help!Roselyn Reef, RN  ----- Message -----  From: Katina Degree, RN  Sent: 12/03/2019   3:13 PM CST  To: Baldwin Crown, RN    Patient was scheduled for lasix infusion on Friday and today, 12/03/19.   ----- Message -----  From: Baldwin Crown, RN  Sent: 12/03/2019   2:42 PM CST  To: Katina Degree, RN, Asencion Noble    Can one of you respond back to Dr. Ricard Dillon on when she is having additional lasix infusions?  The note says Lasix 40x2 over the next week.  I'm not sure when/if these are scheduled.    Thanks for your help!  ----- Message -----  From: Michiel Cowboy, MD  Sent: 12/03/2019   2:28 PM CST  To: Baldwin Crown, RN    Looks OK.  Does she need additional infusions?  ----- Message -----  From: Baldwin Crown, RN  Sent: 11/30/2019   3:48 PM CST  To: Michiel Cowboy, MD    Labs for your review, pt had Lasix infusion today in [place].  Please let myself or the [place] RNs know if you have any additional recommendations. Thanks!

## 2019-12-10 ENCOUNTER — Encounter: Admit: 2019-12-10 | Discharge: 2019-12-10 | Payer: MEDICARE

## 2019-12-10 DIAGNOSIS — I480 Paroxysmal atrial fibrillation: Secondary | ICD-10-CM

## 2019-12-10 DIAGNOSIS — I4891 Unspecified atrial fibrillation: Secondary | ICD-10-CM

## 2019-12-10 DIAGNOSIS — I5031 Acute diastolic (congestive) heart failure: Secondary | ICD-10-CM

## 2019-12-10 LAB — BASIC METABOLIC PANEL
Lab: 1
Lab: 102
Lab: 120 — ABNORMAL HIGH (ref 70–105)
Lab: 139
Lab: 22 — ABNORMAL HIGH (ref 9.8–20.1)
Lab: 24
Lab: 3.7
Lab: 8.6

## 2019-12-10 LAB — BNP (B-TYPE NATRIURETIC PEPTI): Lab: 167 — ABNORMAL HIGH (ref 0–100)

## 2019-12-10 LAB — MAGNESIUM: Lab: 1.6

## 2019-12-18 ENCOUNTER — Encounter: Admit: 2019-12-18 | Discharge: 2019-12-18 | Payer: MEDICARE

## 2019-12-18 MED ORDER — PROPRANOLOL 40 MG PO TAB
ORAL_TABLET | 3 refills | Status: DC
Start: 2019-12-18 — End: 2019-12-25

## 2019-12-24 ENCOUNTER — Encounter: Admit: 2019-12-24 | Discharge: 2019-12-24 | Payer: MEDICARE

## 2019-12-24 ENCOUNTER — Ambulatory Visit: Admit: 2019-12-24 | Discharge: 2019-12-24 | Payer: MEDICARE

## 2019-12-24 DIAGNOSIS — I480 Paroxysmal atrial fibrillation: Secondary | ICD-10-CM

## 2019-12-24 DIAGNOSIS — I10 Essential (primary) hypertension: Secondary | ICD-10-CM

## 2019-12-24 DIAGNOSIS — G4733 Obstructive sleep apnea (adult) (pediatric): Secondary | ICD-10-CM

## 2019-12-24 DIAGNOSIS — I5031 Acute diastolic (congestive) heart failure: Secondary | ICD-10-CM

## 2019-12-25 ENCOUNTER — Encounter: Admit: 2019-12-25 | Discharge: 2019-12-25 | Payer: MEDICARE

## 2019-12-25 ENCOUNTER — Ambulatory Visit: Admit: 2019-12-25 | Discharge: 2019-12-25 | Payer: MEDICARE

## 2019-12-25 DIAGNOSIS — IMO0002 Ulcer: Secondary | ICD-10-CM

## 2019-12-25 DIAGNOSIS — E785 Hyperlipidemia, unspecified: Secondary | ICD-10-CM

## 2019-12-25 DIAGNOSIS — I280 Arteriovenous fistula of pulmonary vessels: Secondary | ICD-10-CM

## 2019-12-25 DIAGNOSIS — D899 Disorder involving the immune mechanism, unspecified: Secondary | ICD-10-CM

## 2019-12-25 DIAGNOSIS — M1990 Unspecified osteoarthritis, unspecified site: Secondary | ICD-10-CM

## 2019-12-25 DIAGNOSIS — M810 Age-related osteoporosis without current pathological fracture: Secondary | ICD-10-CM

## 2019-12-25 DIAGNOSIS — E119 Type 2 diabetes mellitus without complications: Secondary | ICD-10-CM

## 2019-12-25 DIAGNOSIS — I480 Paroxysmal atrial fibrillation: Secondary | ICD-10-CM

## 2019-12-25 DIAGNOSIS — G4733 Obstructive sleep apnea (adult) (pediatric): Secondary | ICD-10-CM

## 2019-12-25 DIAGNOSIS — I5031 Acute diastolic (congestive) heart failure: Secondary | ICD-10-CM

## 2019-12-25 DIAGNOSIS — R1011 Right upper quadrant pain: Secondary | ICD-10-CM

## 2019-12-25 DIAGNOSIS — K219 Gastro-esophageal reflux disease without esophagitis: Secondary | ICD-10-CM

## 2019-12-25 DIAGNOSIS — M069 Rheumatoid arthritis, unspecified: Secondary | ICD-10-CM

## 2019-12-25 DIAGNOSIS — I10 Essential (primary) hypertension: Secondary | ICD-10-CM

## 2019-12-25 DIAGNOSIS — G5600 Carpal tunnel syndrome, unspecified upper limb: Secondary | ICD-10-CM

## 2019-12-25 DIAGNOSIS — I519 Heart disease, unspecified: Secondary | ICD-10-CM

## 2019-12-25 MED ORDER — POTASSIUM CHLORIDE 20 MEQ PO TBTQ
20 meq | ORAL_TABLET | Freq: Two times a day (BID) | ORAL | 3 refills | 30.00000 days | Status: DC
Start: 2019-12-25 — End: 2020-04-29

## 2019-12-25 MED ORDER — TORSEMIDE 20 MG PO TAB
40 mg | ORAL_TABLET | Freq: Every day | ORAL | 3 refills | 67.50000 days | Status: DC
Start: 2019-12-25 — End: 2020-01-10

## 2019-12-25 MED ORDER — METOPROLOL SUCCINATE 100 MG PO TB24
100 mg | ORAL_TABLET | Freq: Every day | ORAL | 1 refills | 90.00000 days | Status: AC
Start: 2019-12-25 — End: ?

## 2019-12-25 NOTE — Progress Notes
Long Term Monitor Placement Record  Ordering Physician: OWENS   Diagnosis: AFIB  Length: 14 days  Serial Number: [id number]    Placed in clinic by  S.

## 2019-12-25 NOTE — Telephone Encounter
-----   Message from Vanice Sarah, MD sent at 12/25/2019 12:50 PM CST -----  Atch Nursing, can you please let her know this looked OK?  Did she get in for IV therapy--is peripheral edema better?  Thanks.    Cc:  Dr. Chilton Si

## 2019-12-27 ENCOUNTER — Encounter: Admit: 2019-12-27 | Discharge: 2019-12-27 | Payer: MEDICARE

## 2019-12-27 NOTE — Progress Notes
Rylah's daughter stopped by the [facility] clinic 1.7.2021 per JST to get a scale. Scale given

## 2019-12-31 IMAGING — CT Abdomen^1_ABD_PELVIS_WO_W (Adult)
1 of 2 series · 13 of 32 positions shown, 19 images · IV contrast (APPLIED)
Comparison: none

[Series 2: abd/pel w/o 5.0 soft tissue · axial · non-contrast · 0.98mm/px · z∈[-447,-72]mm · 13 of 87 slices shown, 19 images]
[im 6/87  soft-tissue]
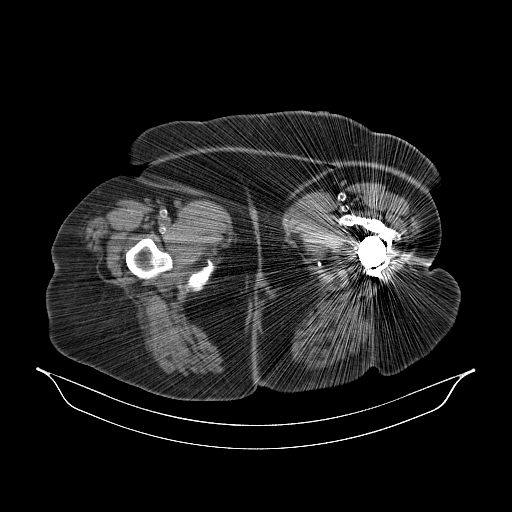
[im 6/87  bone]
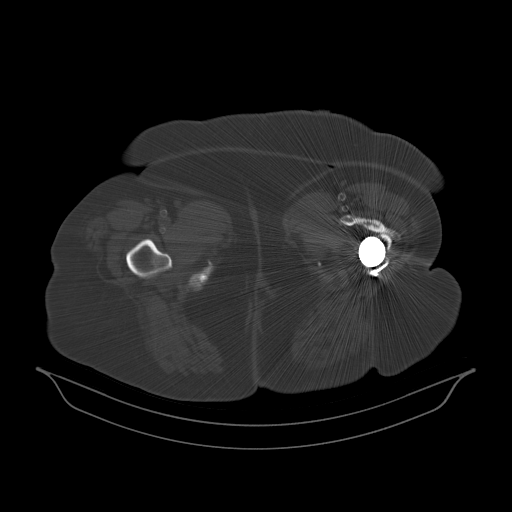
[im 12/87  soft-tissue]
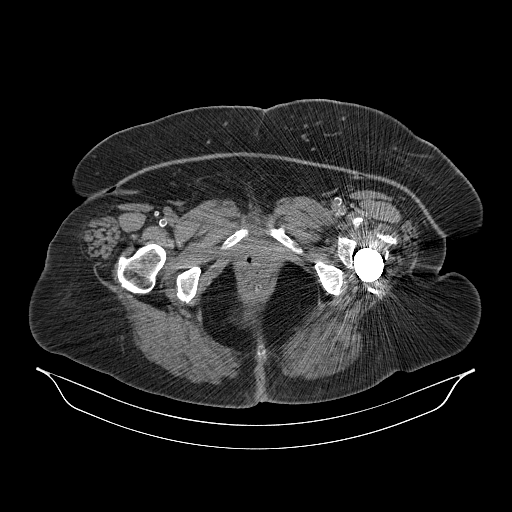
[im 18/87  soft-tissue]
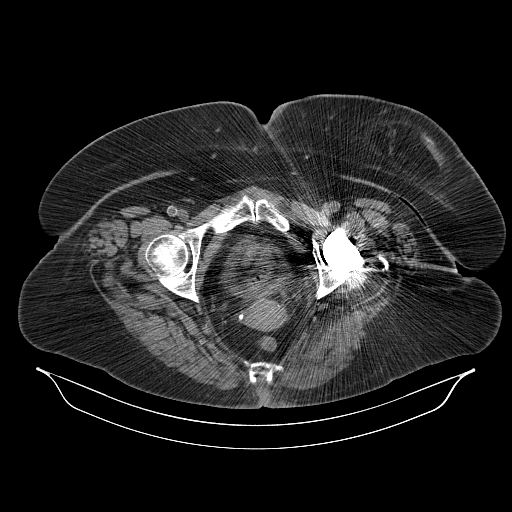
[im 23/87  soft-tissue]
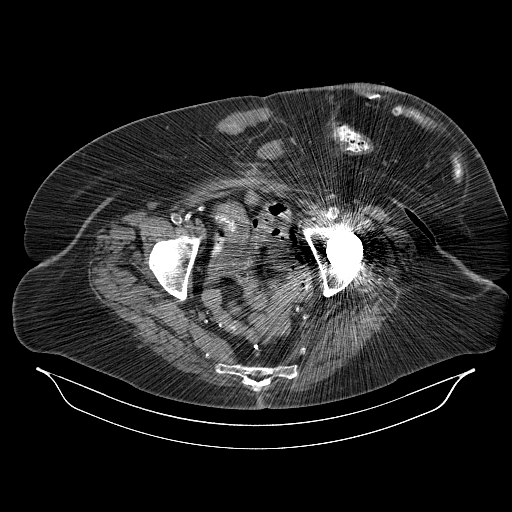
[im 29/87  soft-tissue]
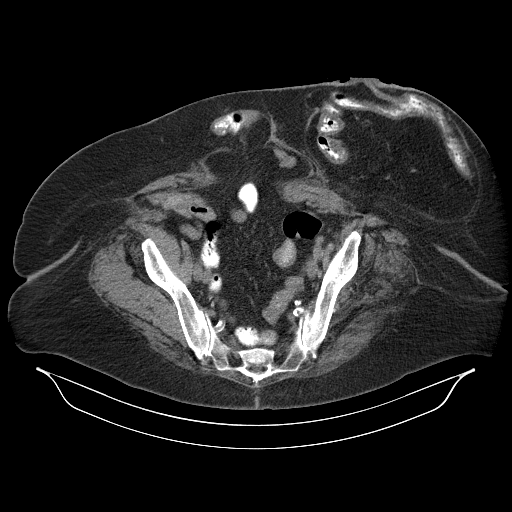
[im 35/87  soft-tissue]
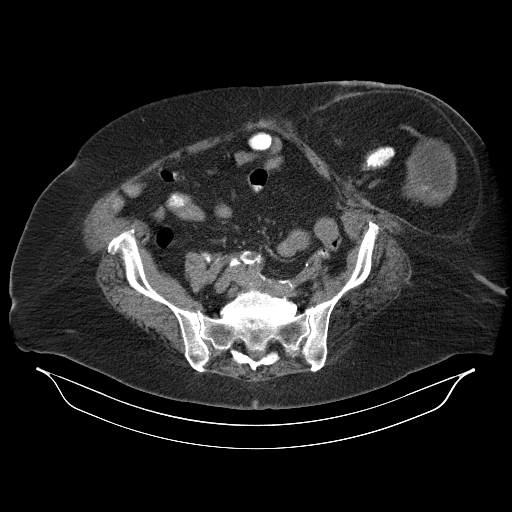
[im 46/87  soft-tissue]
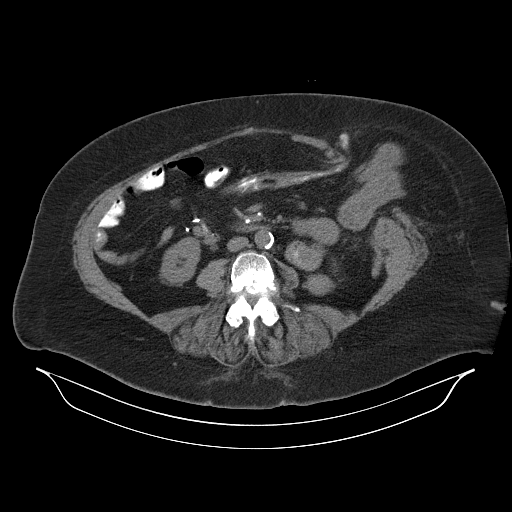
[im 52/87  soft-tissue]
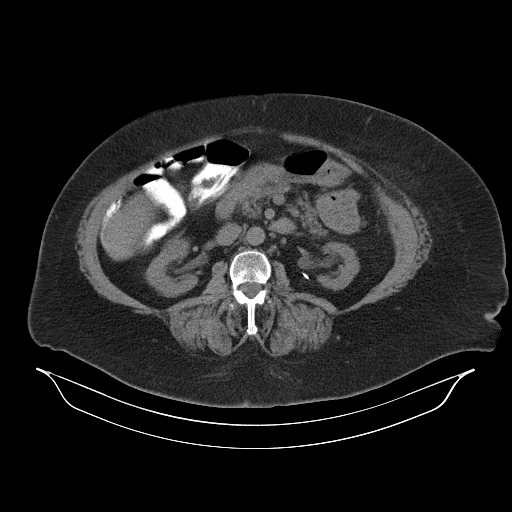
[im 58/87  soft-tissue]
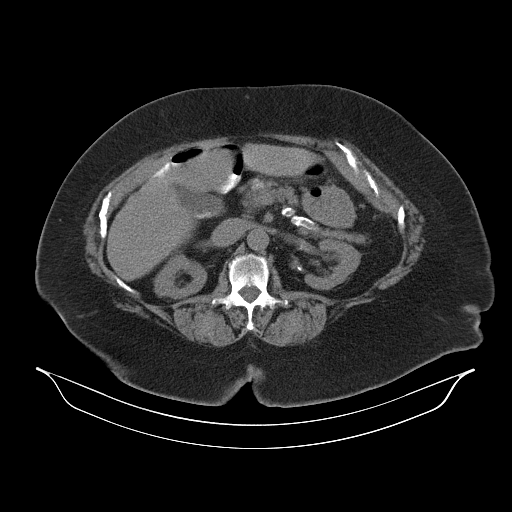
[im 58/87  bone]
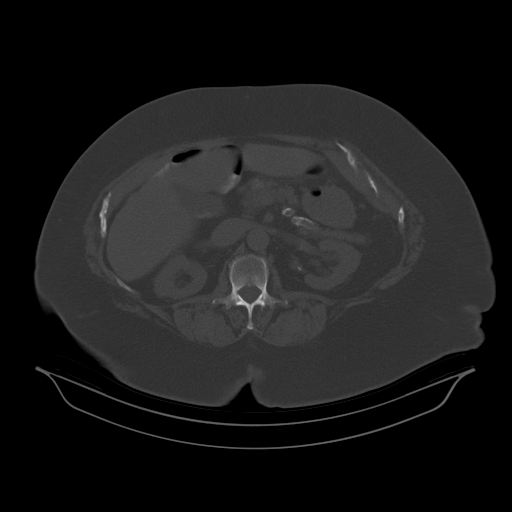
[im 64/87  soft-tissue]
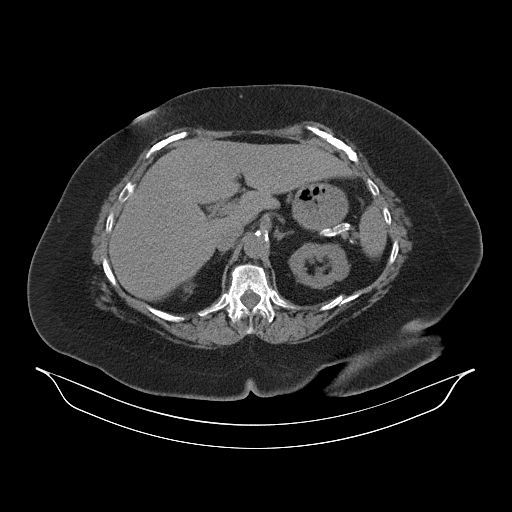
[im 64/87  lung]
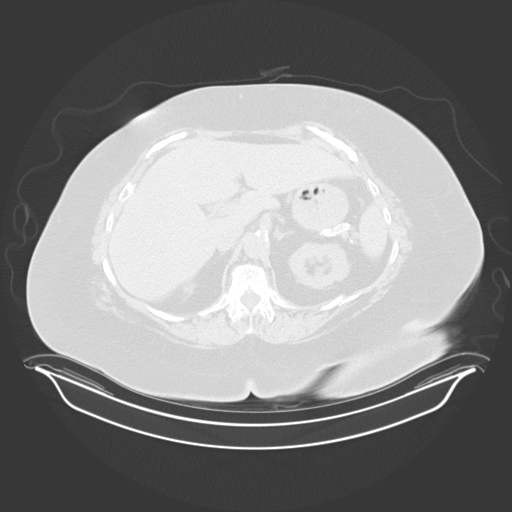
[im 69/87  soft-tissue]
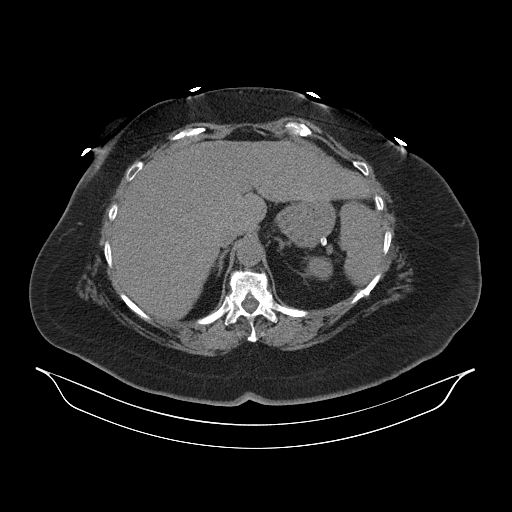
[im 69/87  lung]
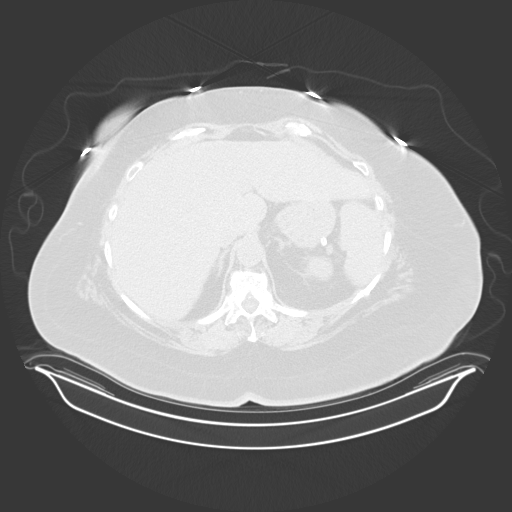
[im 75/87  soft-tissue]
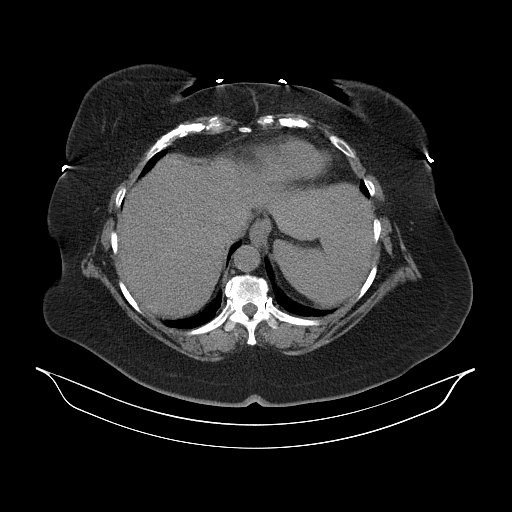
[im 75/87  lung]
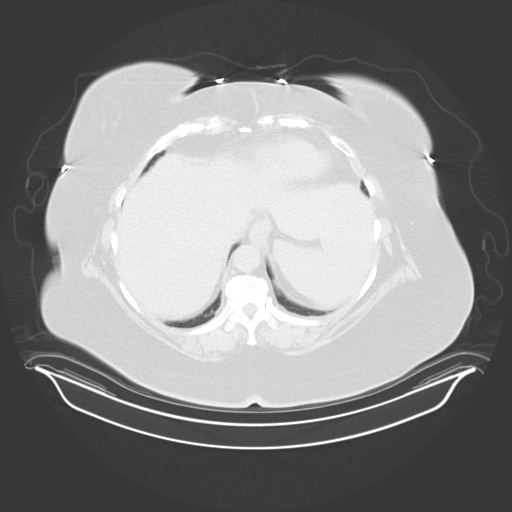
[im 81/87  soft-tissue]
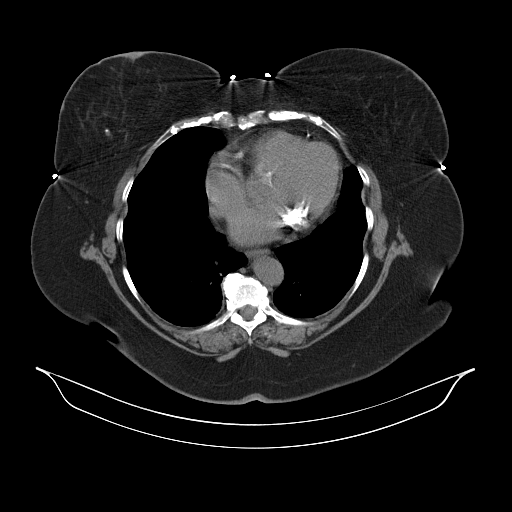
[im 81/87  lung]
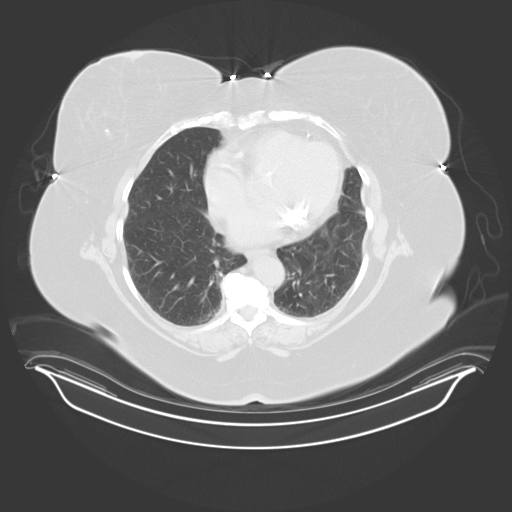

[13 of 32 positions shown; findings below may reference images not displayed]

DIAGNOSTIC STUDIES

EXAM
COMPUTED TOMOGRAPHY, ABDOMEN AND PELVIS; WITH AND WITHOUT CONTRAST MATERIAL

INDICATION
COLOSTOMY. ABDOMINAL PAIN. EVALUATE FOR HERNIA.

TECHNIQUE
Contiguous axial tomographic images were obtained from the lung bases to the symphysis pubis with
both oral and intravenous contrast.contrast.
All CT scans at this facility use dose modulation, iterative reconstruction, and/or weight based
dosing when appropriate to reduce radiation dose to as low as reasonably achievable.

COMPARISONS
No priors available for comparison.

FINDINGS
[Large amount of calcified plaque versus stents within the coronary arteries. The lung bases are
clear. The liver, spleen, pancreas and adrenal glands appear grossly unremarkable. Cholelithiasis.
No hydronephrosis. Small renal cysts. No bowel obstruction or free air. Ostomy within the left upper
pelvis. There is a large parastomal hernia containing a portion of the stomach and loops of colon.
Anterior pelvic hernia containing loops of small-bowel with a neck measuring 3.8 centimeters.
Diastasis recti. Possible prior right hemicolectomy. There is at least mild gastric mucosal
thickening. Moderate 2 severe atherosclerotic disease. Subcentimeter retroperitoneal nodes. The
urinary bladder is incompletely distended. Significant artifact at the level of the pelvis secondary
to a left hip arthroplasty. The uterus and adnexa are partially obscured. Mild atrophy of the left
iliopsoas muscle. Grade 1 spondylolisthesis at the level of L3 and L5. There is bilateral
spondylolysis at the level of L5. Severe degenerative disc disease at the level of L3/4 and L5/S1.

IMPRESSION
1. Ostomy. There is a large parastomal hernia containing a portion of the stomach and loops of
colon.
2. Diastasis recti. Anterior pelvic hernia containing loops of small-bowel.
3. Cholelithiasis.

Tech Notes:

## 2020-01-08 ENCOUNTER — Encounter: Admit: 2020-01-08 | Discharge: 2020-01-08 | Payer: MEDICARE

## 2020-01-08 DIAGNOSIS — I280 Arteriovenous fistula of pulmonary vessels: Secondary | ICD-10-CM

## 2020-01-08 DIAGNOSIS — IMO0002 Ulcer: Secondary | ICD-10-CM

## 2020-01-08 DIAGNOSIS — M810 Age-related osteoporosis without current pathological fracture: Secondary | ICD-10-CM

## 2020-01-08 DIAGNOSIS — D899 Disorder involving the immune mechanism, unspecified: Secondary | ICD-10-CM

## 2020-01-08 DIAGNOSIS — M1990 Unspecified osteoarthritis, unspecified site: Secondary | ICD-10-CM

## 2020-01-08 DIAGNOSIS — K219 Gastro-esophageal reflux disease without esophagitis: Secondary | ICD-10-CM

## 2020-01-08 DIAGNOSIS — M069 Rheumatoid arthritis, unspecified: Secondary | ICD-10-CM

## 2020-01-08 DIAGNOSIS — G5600 Carpal tunnel syndrome, unspecified upper limb: Secondary | ICD-10-CM

## 2020-01-08 DIAGNOSIS — E785 Hyperlipidemia, unspecified: Secondary | ICD-10-CM

## 2020-01-08 DIAGNOSIS — E119 Type 2 diabetes mellitus without complications: Secondary | ICD-10-CM

## 2020-01-09 NOTE — Progress Notes
attempted to reach pt about labs ordered for upcoming ov w/ JST. no answer. lmom to have labs drawn/ call back number if any questions

## 2020-01-10 ENCOUNTER — Encounter: Admit: 2020-01-10 | Discharge: 2020-01-10 | Payer: MEDICARE

## 2020-01-10 DIAGNOSIS — I10 Essential (primary) hypertension: Secondary | ICD-10-CM

## 2020-01-10 DIAGNOSIS — I5032 Chronic diastolic (congestive) heart failure: Secondary | ICD-10-CM

## 2020-01-10 DIAGNOSIS — D899 Disorder involving the immune mechanism, unspecified: Secondary | ICD-10-CM

## 2020-01-10 DIAGNOSIS — E785 Hyperlipidemia, unspecified: Secondary | ICD-10-CM

## 2020-01-10 DIAGNOSIS — M1990 Unspecified osteoarthritis, unspecified site: Secondary | ICD-10-CM

## 2020-01-10 DIAGNOSIS — I480 Paroxysmal atrial fibrillation: Secondary | ICD-10-CM

## 2020-01-10 DIAGNOSIS — I280 Arteriovenous fistula of pulmonary vessels: Secondary | ICD-10-CM

## 2020-01-10 DIAGNOSIS — K219 Gastro-esophageal reflux disease without esophagitis: Secondary | ICD-10-CM

## 2020-01-10 DIAGNOSIS — M069 Rheumatoid arthritis, unspecified: Secondary | ICD-10-CM

## 2020-01-10 DIAGNOSIS — IMO0002 Ulcer: Secondary | ICD-10-CM

## 2020-01-10 DIAGNOSIS — G5600 Carpal tunnel syndrome, unspecified upper limb: Secondary | ICD-10-CM

## 2020-01-10 DIAGNOSIS — E119 Type 2 diabetes mellitus without complications: Secondary | ICD-10-CM

## 2020-01-10 DIAGNOSIS — M810 Age-related osteoporosis without current pathological fracture: Secondary | ICD-10-CM

## 2020-01-10 LAB — BASIC METABOLIC PANEL
Lab: 1.3 — ABNORMAL HIGH (ref 0.57–1.11)
Lab: 139
Lab: 17 — ABNORMAL HIGH (ref 0–14)
Lab: 185 — ABNORMAL HIGH (ref 70–105)
Lab: 2.9 — ABNORMAL LOW (ref 3.5–5.1)
Lab: 29
Lab: 44 — ABNORMAL HIGH (ref 9.8–20.1)
Lab: 9
Lab: 96 — ABNORMAL LOW (ref 98–107)

## 2020-01-10 LAB — BNP (B-TYPE NATRIURETIC PEPTI): Lab: 172 — ABNORMAL HIGH (ref 0–100)

## 2020-01-10 LAB — MAGNESIUM: Lab: 1.5 — ABNORMAL LOW (ref 1.6–2.6)

## 2020-01-10 MED ORDER — TORSEMIDE 20 MG PO TAB
20 mg | ORAL_TABLET | Freq: Every day | ORAL | 3 refills | 67.50000 days | Status: AC
Start: 2020-01-10 — End: ?

## 2020-01-10 NOTE — Telephone Encounter
Lab results discussed with JST by Janeann Forehand in person in clinic. Patient in the office at the time.

## 2020-01-18 ENCOUNTER — Encounter: Admit: 2020-01-18 | Discharge: 2020-01-18 | Payer: MEDICARE

## 2020-02-10 ENCOUNTER — Encounter: Admit: 2020-02-10 | Discharge: 2020-02-10 | Payer: MEDICARE

## 2020-02-10 DIAGNOSIS — I280 Arteriovenous fistula of pulmonary vessels: Secondary | ICD-10-CM

## 2020-02-10 DIAGNOSIS — E785 Hyperlipidemia, unspecified: Secondary | ICD-10-CM

## 2020-02-10 DIAGNOSIS — K219 Gastro-esophageal reflux disease without esophagitis: Secondary | ICD-10-CM

## 2020-02-10 DIAGNOSIS — M810 Age-related osteoporosis without current pathological fracture: Secondary | ICD-10-CM

## 2020-02-10 DIAGNOSIS — IMO0002 Ulcer: Secondary | ICD-10-CM

## 2020-02-10 DIAGNOSIS — M069 Rheumatoid arthritis, unspecified: Secondary | ICD-10-CM

## 2020-02-10 DIAGNOSIS — E119 Type 2 diabetes mellitus without complications: Secondary | ICD-10-CM

## 2020-02-10 DIAGNOSIS — D899 Disorder involving the immune mechanism, unspecified: Secondary | ICD-10-CM

## 2020-02-10 DIAGNOSIS — G5600 Carpal tunnel syndrome, unspecified upper limb: Secondary | ICD-10-CM

## 2020-02-10 DIAGNOSIS — M1990 Unspecified osteoarthritis, unspecified site: Secondary | ICD-10-CM

## 2020-02-18 ENCOUNTER — Encounter: Admit: 2020-02-18 | Discharge: 2020-02-18 | Payer: MEDICARE

## 2020-02-28 ENCOUNTER — Encounter: Admit: 2020-02-28 | Discharge: 2020-02-28 | Payer: MEDICARE

## 2020-02-28 NOTE — Telephone Encounter
Spoke to patient to let them know the result of the sleep study has been scanned into EMR and referring doctor notified.  Also attempted to schedule patient for a follow up consultation and CPAP set up with the Sleep Specialist per the study recommendations.  Patient declined to schedule follow up with Sleep Medicine, preferring to see PCP regarding this.

## 2020-04-29 ENCOUNTER — Encounter: Admit: 2020-04-29 | Discharge: 2020-04-29 | Payer: MEDICARE

## 2020-04-29 MED ORDER — POTASSIUM CHLORIDE 20 MEQ PO TBTQ
ORAL_TABLET | Freq: Two times a day (BID) | 11 refills | 30.00000 days | Status: AC
Start: 2020-04-29 — End: ?

## 2020-05-07 MED ORDER — HEPARIN (PORCINE) 10,000 UNIT/ML IJ SOLN (IMS)
7500 [IU] | SUBCUTANEOUS | 0 refills | Status: DC
Start: 2020-05-07 — End: 2020-05-08
  Administered 2020-05-08: 12:00:00 7500 [IU] via SUBCUTANEOUS

## 2020-05-07 MED ORDER — INSULIN ASPART 100 UNIT/ML SC FLEXPEN
0-12 [IU] | Freq: Before meals | SUBCUTANEOUS | 0 refills | Status: DC
Start: 2020-05-07 — End: 2020-05-11

## 2020-05-07 MED ORDER — PIPERACILLIN/TAZOBACTAM 4.5 G/100ML NS IVPB (MB+)
4.5 g | INTRAVENOUS | 0 refills | Status: DC
Start: 2020-05-07 — End: 2020-05-08
  Administered 2020-05-08 (×6): 4.5 g via INTRAVENOUS

## 2020-05-07 MED ORDER — VANCOMYCIN 1,250 MG IVPB
1250 mg | Freq: Two times a day (BID) | INTRAVENOUS | 0 refills | Status: DC
Start: 2020-05-07 — End: 2020-05-07

## 2020-05-07 MED ORDER — GABAPENTIN 300 MG PO CAP
300 mg | Freq: Once | ORAL | 0 refills | Status: CP
Start: 2020-05-07 — End: ?

## 2020-05-07 MED ORDER — VANCOMYCIN IVPB
15 mg/kg | INTRAVENOUS | 0 refills | Status: DC
Start: 2020-05-07 — End: 2020-05-07

## 2020-05-07 MED ORDER — IOHEXOL 350 MG IODINE/ML IV SOLN
100 mL | Freq: Once | INTRAVENOUS | 0 refills | Status: CP
Start: 2020-05-07 — End: ?

## 2020-05-07 MED ORDER — PERFLUTREN LIPID MICROSPHERES 1.1 MG/ML IV SUSP
1-20 mL | Freq: Once | INTRAVENOUS | 0 refills | Status: AC | PRN
Start: 2020-05-07 — End: ?

## 2020-05-07 MED ORDER — NYSTATIN 100,000 UNIT/GRAM TP POWD
Freq: Two times a day (BID) | TOPICAL | 0 refills | Status: DC
Start: 2020-05-07 — End: 2020-06-11
  Administered 2020-05-09 – 2020-06-10 (×8): via TOPICAL

## 2020-05-07 MED ORDER — LACTATED RINGERS IV SOLP
250 mL | INTRAVENOUS | 0 refills | Status: CP
Start: 2020-05-07 — End: ?

## 2020-05-07 MED ORDER — NOREPINEPHRINE BITARTRATE-NACL 4 MG/250 ML (16 MCG/ML) IV SOLN
0-.5 ug/kg/min | INTRAVENOUS | 0 refills | Status: DC
Start: 2020-05-07 — End: 2020-05-13
  Administered 2020-05-08: 12:00:00 0.02 ug/kg/min via INTRAVENOUS
  Administered 2020-05-09: 17:00:00 0.03 ug/kg/min via INTRAVENOUS
  Administered 2020-05-10: 09:00:00 0.08 ug/kg/min via INTRAVENOUS
  Administered 2020-05-11: 09:00:00 0.05 ug/kg/min via INTRAVENOUS

## 2020-05-07 MED ORDER — OXYCODONE 5 MG PO TAB
5 mg | ORAL | 0 refills | Status: DC | PRN
Start: 2020-05-07 — End: 2020-05-10
  Administered 2020-05-08 – 2020-05-10 (×5): 5 mg via ORAL

## 2020-05-07 MED ORDER — MAGNESIUM SULFATE IN D5W 1 GRAM/100 ML IV PGBK
1 g | INTRAVENOUS | 0 refills | Status: AC
Start: 2020-05-07 — End: ?

## 2020-05-07 MED ORDER — VANCOMYCIN 2,250 MG IVPB
2250 mg | Freq: Two times a day (BID) | INTRAVENOUS | 0 refills | Status: DC
Start: 2020-05-07 — End: 2020-05-07

## 2020-05-07 MED ORDER — MAGNESIUM SULFATE IN D5W 1 GRAM/100 ML IV PGBK
1 g | INTRAVENOUS | 0 refills | Status: CP
Start: 2020-05-07 — End: ?

## 2020-05-07 MED ORDER — VANCOMYCIN 2,250 MG IVPB
2250 mg | INTRAVENOUS | 0 refills | Status: CP
Start: 2020-05-07 — End: ?

## 2020-05-07 MED ORDER — ACETAMINOPHEN 325 MG PO TAB
650 mg | ORAL | 0 refills | Status: DC | PRN
Start: 2020-05-07 — End: 2020-05-16
  Administered 2020-05-08 – 2020-05-16 (×15): 650 mg via ORAL

## 2020-05-07 MED ORDER — LEFLUNOMIDE 10 MG PO TAB
20 mg | Freq: Every day | ORAL | 0 refills | Status: DC
Start: 2020-05-07 — End: 2020-05-16
  Administered 2020-05-08 – 2020-05-16 (×9): 20 mg via ORAL

## 2020-05-07 MED ORDER — POTASSIUM CHLORIDE 10 MEQ PO TBTQ
40 meq | Freq: Once | ORAL | 0 refills | Status: CP
Start: 2020-05-07 — End: ?

## 2020-05-07 MED ORDER — SODIUM CHLORIDE 0.9 % IJ SOLN
50 mL | Freq: Once | INTRAVENOUS | 0 refills | Status: CP
Start: 2020-05-07 — End: ?

## 2020-05-07 MED ORDER — FENTANYL CITRATE (PF) 50 MCG/ML IJ SOLN
12.5-25 ug | INTRAVENOUS | 0 refills | Status: DC | PRN
Start: 2020-05-07 — End: 2020-05-10
  Administered 2020-05-08 – 2020-05-10 (×6): 25 ug via INTRAVENOUS

## 2020-05-07 MED ORDER — PANTOPRAZOLE 20 MG PO TBEC
20 mg | Freq: Every day | ORAL | 0 refills | Status: DC
Start: 2020-05-07 — End: 2020-05-14
  Administered 2020-05-09 – 2020-05-14 (×6): 20 mg via ORAL

## 2020-05-07 MED ORDER — VANCOMYCIN 1,250 MG IVPB
1250 mg | INTRAVENOUS | 0 refills | Status: DC
Start: 2020-05-07 — End: 2020-05-07

## 2020-05-07 MED ORDER — VANCOMYCIN PHARMACY TO MANAGE
1 | 0 refills | Status: DC
Start: 2020-05-07 — End: 2020-05-10

## 2020-05-07 MED ORDER — VANCOMYCIN RANDOM DOSING
1 | INTRAVENOUS | 0 refills | Status: DC
Start: 2020-05-07 — End: 2020-05-10

## 2020-05-08 ENCOUNTER — Encounter: Admit: 2020-05-08 | Discharge: 2020-05-08 | Payer: MEDICARE

## 2020-05-08 ENCOUNTER — Inpatient Hospital Stay: Admit: 2020-05-08 | Discharge: 2020-05-08 | Payer: MEDICARE

## 2020-05-08 DIAGNOSIS — Z006 Encounter for examination for normal comparison and control in clinical research program: Secondary | ICD-10-CM

## 2020-05-08 MED ORDER — SIMVASTATIN 40 MG PO TAB
80 mg | Freq: Every evening | ORAL | 0 refills | Status: DC
Start: 2020-05-08 — End: 2020-05-10
  Administered 2020-05-09 – 2020-05-10 (×2): 80 mg via ORAL

## 2020-05-08 MED ORDER — POTASSIUM CHLORIDE 20 MEQ PO TBTQ
40 meq | Freq: Once | ORAL | 0 refills | Status: CP
Start: 2020-05-08 — End: ?
  Administered 2020-05-08: 12:00:00 40 meq via ORAL

## 2020-05-08 MED ORDER — METOPROLOL TARTRATE 5 MG/5 ML IV SOLN
5 mg | Freq: Once | INTRAVENOUS | 0 refills | Status: CP
Start: 2020-05-08 — End: ?
  Administered 2020-05-08: 16:00:00 5 mg via INTRAVENOUS

## 2020-05-08 MED ORDER — METOPROLOL TARTRATE 5 MG/5 ML IV SOLN
5 mg | INTRAVENOUS | 0 refills | Status: DC
Start: 2020-05-08 — End: 2020-05-11
  Administered 2020-05-08 – 2020-05-10 (×9): 5 mg via INTRAVENOUS

## 2020-05-08 MED ORDER — GABAPENTIN 100 MG PO CAP
200 mg | Freq: Two times a day (BID) | ORAL | 0 refills | Status: DC
Start: 2020-05-08 — End: 2020-05-11
  Administered 2020-05-08 – 2020-05-11 (×6): 200 mg via ORAL

## 2020-05-08 MED ORDER — HEPARIN, PORCINE (PF) 5,000 UNIT/0.5 ML IJ SYRG
5000 [IU] | SUBCUTANEOUS | 0 refills | Status: DC
Start: 2020-05-08 — End: 2020-05-08
  Administered 2020-05-08: 19:00:00 5000 [IU] via SUBCUTANEOUS

## 2020-05-08 MED ORDER — FUROSEMIDE 10 MG/ML IJ SOLN
40 mg | Freq: Once | INTRAVENOUS | 0 refills | Status: CP
Start: 2020-05-08 — End: ?
  Administered 2020-05-08: 15:00:00 40 mg via INTRAVENOUS

## 2020-05-08 MED ORDER — HEPARIN (PORCINE) 10,000 UNIT/ML IJ SOLN (IMS)
7500 [IU] | SUBCUTANEOUS | 0 refills | Status: DC
Start: 2020-05-08 — End: 2020-05-09
  Administered 2020-05-09 (×2): 7500 [IU] via SUBCUTANEOUS

## 2020-05-08 MED ORDER — APIXABAN 5 MG PO TAB
5 mg | Freq: Two times a day (BID) | ORAL | 0 refills | Status: DC
Start: 2020-05-08 — End: 2020-05-08

## 2020-05-08 MED ORDER — PIPERACILLIN/TAZOBACTAM 3.375 G/100ML NS IVPB (MB+)
3.375 g | INTRAVENOUS | 0 refills | Status: DC
Start: 2020-05-08 — End: 2020-05-09
  Administered 2020-05-08 – 2020-05-09 (×5): 3.375 g via INTRAVENOUS

## 2020-05-08 NOTE — Research Notes
Research Informed Consent Note    NAME: Madison Johnston             MRN: [id number]             DOB:Jan 27, 1946          AGE: 74 y.o.    IRB Number: [id number]  Consent Approval Dates: 03/24/2020    Clinical research participation and research nature of the trial were discussed with subject. The subject was alert and oriented during consent discussion and was accompanied by n/a. Subject was informed that study is voluntary and  she may withdraw consent at any time for any reason by notifying study team.  Study purpose, procedures, risks and duration of the study and confidentiality information were discussed.  Alternatives to participation were discussed per consent form.  Subject verbalized understanding.    Subject was provided time to review the consent form.  All questions asked were answered.  Subject voiced desire to participate in the study and signed the informed consent form without coercion and undue influence.  A copy of the signed consent was given to the subject as well as contact information for the study team.  A copy of the consent form was e-mailed to [facility] Information Management ([facility]) for scanning into the subject's medical record.    No research procedures took place prior to consenting.

## 2020-05-09 ENCOUNTER — Inpatient Hospital Stay: Admit: 2020-05-09 | Discharge: 2020-05-09 | Payer: MEDICARE

## 2020-05-09 ENCOUNTER — Encounter: Admit: 2020-05-09 | Discharge: 2020-05-09 | Payer: MEDICARE

## 2020-05-09 DIAGNOSIS — L03116 Cellulitis of left lower limb: Secondary | ICD-10-CM

## 2020-05-09 MED ORDER — SENNOSIDES 8.6 MG PO TAB
2 | Freq: Two times a day (BID) | ORAL | 0 refills | Status: DC
Start: 2020-05-09 — End: 2020-05-14
  Administered 2020-05-10 – 2020-05-13 (×7): 2 via ORAL

## 2020-05-09 MED ORDER — FENTANYL CITRATE (PF) 50 MCG/ML IJ SOLN
INTRAVENOUS | 0 refills | Status: DC
Start: 2020-05-09 — End: 2020-05-09
  Administered 2020-05-09 (×2): 50 ug via INTRAVENOUS

## 2020-05-09 MED ORDER — KETAMINE 10 MG/ML IJ SOLN
INTRAVENOUS | 0 refills | Status: DC
Start: 2020-05-09 — End: 2020-05-09
  Administered 2020-05-09: 16:00:00 20 mg via INTRAVENOUS
  Administered 2020-05-09: 16:00:00 10 mg via INTRAVENOUS

## 2020-05-09 MED ORDER — HYOSCYAMINE SULFATE 0.125 MG PO TBDI
.125 mg | Freq: Once | SUBLINGUAL | 0 refills | Status: CP
Start: 2020-05-09 — End: ?
  Administered 2020-05-09: 14:00:00 0.125 mg via SUBLINGUAL

## 2020-05-09 MED ORDER — APIXABAN 5 MG PO TAB
5 mg | Freq: Two times a day (BID) | ORAL | 0 refills | Status: DC
Start: 2020-05-09 — End: 2020-05-09

## 2020-05-09 MED ORDER — PIPERACILLIN/TAZOBACTAM 2.25 G/100ML NS IVPB (MB+)
2.25 g | INTRAVENOUS | 0 refills | Status: DC
Start: 2020-05-09 — End: 2020-05-16
  Administered 2020-05-09 – 2020-05-16 (×57): 2.25 g via INTRAVENOUS

## 2020-05-09 MED ORDER — POLYETHYLENE GLYCOL 3350 17 GRAM PO PWPK
1 | Freq: Two times a day (BID) | ORAL | 0 refills | Status: DC
Start: 2020-05-09 — End: 2020-05-19
  Administered 2020-05-10 – 2020-05-16 (×11): 17 g via ORAL

## 2020-05-09 MED ORDER — ALBUMIN, HUMAN 25 % IV SOLP
25 g | Freq: Once | INTRAVENOUS | 0 refills | Status: CP
Start: 2020-05-09 — End: ?
  Administered 2020-05-09: 23:00:00 25 g via INTRAVENOUS

## 2020-05-09 MED ORDER — SODIUM CHLORIDE 0.9 % IV SOLP
INTRAVENOUS | 0 refills | Status: DC
Start: 2020-05-09 — End: 2020-05-09
  Administered 2020-05-09: 16:00:00 via INTRAVENOUS

## 2020-05-09 MED ORDER — PROPOFOL INJ 10 MG/ML IV VIAL
INTRAVENOUS | 0 refills | Status: DC
Start: 2020-05-09 — End: 2020-05-09
  Administered 2020-05-09: 16:00:00 20 mg via INTRAVENOUS

## 2020-05-09 MED ORDER — LACTATED RINGERS IV SOLP
500 mL | INTRAVENOUS | 0 refills | Status: CP
Start: 2020-05-09 — End: ?
  Administered 2020-05-09: 13:00:00 500 mL via INTRAVENOUS

## 2020-05-09 MED ORDER — MIDAZOLAM 1 MG/ML IJ SOLN
INTRAVENOUS | 0 refills | Status: DC
Start: 2020-05-09 — End: 2020-05-09
  Administered 2020-05-09: 16:00:00 2 mg via INTRAVENOUS

## 2020-05-09 MED ORDER — LACTATED RINGERS IV SOLP
500 mL | INTRAVENOUS | 0 refills | Status: CP
Start: 2020-05-09 — End: ?
  Administered 2020-05-09: 14:00:00 500 mL via INTRAVENOUS

## 2020-05-09 MED ORDER — EMU OIL 120ML
TOPICAL | 0 refills | Status: DC | PRN
Start: 2020-05-09 — End: 2020-06-11
  Administered 2020-05-09 – 2020-06-07 (×3): 120.000 mL via TOPICAL

## 2020-05-09 NOTE — Anesthesia Post-Procedure Evaluation
Post-Anesthesia Evaluation    Name: Madison Johnston      MRN: [id number]     DOB: 08-14-1946     Age: 74 y.o.     Sex: female   __________________________________________________________________________     Procedure Information     Anesthesia Start Date/Time: 05/09/20 1040    Procedure: ARTHROCENTESIS/ ASPIRATION/ INJECTION MAJOR JOINT/ BURSA (Left Hip)    Location: MAIN OR 53 / Main OR/Periop    Surgeons: Heddings, Revonda Standard, MD          Post-Anesthesia Vitals      Vitals Value Taken Time   BP     Temp     Pulse 109 05/09/20 1139   Respirations 18 PER MINUTE 05/09/20 1139   SpO2 97 % 05/09/20 1139   Vitals shown include unvalidated device data.      Post Anesthesia Evaluation Note    Evaluation location: ICU  Patient participation: recovered; patient participated in evaluation  Level of consciousness: sleepy but conscious    Pain score: 2  Pain management: adequate    Hydration: normovolemia  Temperature: 36.0?C - 38.4?C  Airway patency: adequate    Perioperative Events       Post-op nausea and vomiting: no PONV    Postoperative Status  Cardiovascular status: hemodynamically stable  Respiratory status: spontaneous ventilation  ICU Information  VasoactiveDrips:norepinephrine infusion  Blood Products Given-no                Staff involved in transport include: CRNA and other      Perioperative Events  Perioperative Event: No  Emergency Case Activation: No

## 2020-05-10 ENCOUNTER — Inpatient Hospital Stay: Admit: 2020-05-10 | Discharge: 2020-05-10 | Payer: MEDICARE

## 2020-05-10 MED ORDER — TORSEMIDE 20 MG PO TAB
20 mg | Freq: Once | ORAL | 0 refills | Status: CP
Start: 2020-05-10 — End: ?
  Administered 2020-05-10: 16:00:00 20 mg via ORAL

## 2020-05-10 MED ORDER — DAPTOMYCIN SYR
6 mg/kg | INTRAVENOUS | 0 refills | Status: DC
Start: 2020-05-10 — End: 2020-05-13
  Administered 2020-05-10 – 2020-05-12 (×2): 430 mg via INTRAVENOUS

## 2020-05-10 MED ORDER — LACTATED RINGERS IV SOLP
500 mL | INTRAVENOUS | 0 refills | Status: CP
Start: 2020-05-10 — End: ?
  Administered 2020-05-10: 13:00:00 500 mL via INTRAVENOUS

## 2020-05-10 MED ORDER — LACTATED RINGERS IV SOLP
500 mL | INTRAVENOUS | 0 refills | Status: CP
Start: 2020-05-10 — End: ?
  Administered 2020-05-10: 12:00:00 500 mL via INTRAVENOUS

## 2020-05-10 MED ORDER — HYOSCYAMINE SULFATE 0.125 MG PO TBDI
.125 mg | SUBLINGUAL | 0 refills | Status: DC | PRN
Start: 2020-05-10 — End: 2020-05-20
  Administered 2020-05-11: 14:00:00 0.125 mg via SUBLINGUAL

## 2020-05-10 MED ORDER — APIXABAN 5 MG PO TAB
5 mg | Freq: Two times a day (BID) | ORAL | 0 refills | Status: DC
Start: 2020-05-10 — End: 2020-05-13
  Administered 2020-05-10 – 2020-05-13 (×6): 5 mg via ORAL

## 2020-05-10 MED ORDER — LACTATED RINGERS IV SOLP
500 mL | INTRAVENOUS | 0 refills | Status: CP
Start: 2020-05-10 — End: ?
  Administered 2020-05-10: 11:00:00 500 mL via INTRAVENOUS

## 2020-05-10 MED ORDER — OXYCODONE 5 MG PO TAB
5 mg | ORAL | 0 refills | Status: DC | PRN
Start: 2020-05-10 — End: 2020-05-21
  Administered 2020-05-11 – 2020-05-16 (×8): 5 mg via ORAL

## 2020-05-10 MED ORDER — OXYBUTYNIN CHLORIDE 5 MG PO TAB
5 mg | Freq: Three times a day (TID) | ORAL | 0 refills | Status: DC
Start: 2020-05-10 — End: 2020-05-10
  Administered 2020-05-10 (×2): 5 mg via ORAL

## 2020-05-10 MED ORDER — LACTATED RINGERS IV SOLP
500 mL | INTRAVENOUS | 0 refills | Status: CP
Start: 2020-05-10 — End: ?
  Administered 2020-05-10: 23:00:00 500 mL via INTRAVENOUS

## 2020-05-11 ENCOUNTER — Encounter: Admit: 2020-05-11 | Discharge: 2020-05-11 | Payer: MEDICARE

## 2020-05-11 DIAGNOSIS — M069 Rheumatoid arthritis, unspecified: Secondary | ICD-10-CM

## 2020-05-11 DIAGNOSIS — D899 Disorder involving the immune mechanism, unspecified: Secondary | ICD-10-CM

## 2020-05-11 DIAGNOSIS — E785 Hyperlipidemia, unspecified: Secondary | ICD-10-CM

## 2020-05-11 DIAGNOSIS — M1990 Unspecified osteoarthritis, unspecified site: Secondary | ICD-10-CM

## 2020-05-11 DIAGNOSIS — E119 Type 2 diabetes mellitus without complications: Secondary | ICD-10-CM

## 2020-05-11 DIAGNOSIS — I280 Arteriovenous fistula of pulmonary vessels: Secondary | ICD-10-CM

## 2020-05-11 DIAGNOSIS — IMO0002 Ulcer: Secondary | ICD-10-CM

## 2020-05-11 DIAGNOSIS — M810 Age-related osteoporosis without current pathological fracture: Secondary | ICD-10-CM

## 2020-05-11 DIAGNOSIS — G5600 Carpal tunnel syndrome, unspecified upper limb: Secondary | ICD-10-CM

## 2020-05-11 DIAGNOSIS — K219 Gastro-esophageal reflux disease without esophagitis: Secondary | ICD-10-CM

## 2020-05-11 MED ORDER — MIDODRINE 5 MG PO TAB
5 mg | Freq: Three times a day (TID) | ORAL | 0 refills | Status: DC
Start: 2020-05-11 — End: 2020-05-15
  Administered 2020-05-11 – 2020-05-14 (×11): 5 mg via ORAL

## 2020-05-11 MED ORDER — FUROSEMIDE 10 MG/ML IJ SOLN
40 mg | Freq: Once | INTRAVENOUS | 0 refills | Status: CP
Start: 2020-05-11 — End: ?
  Administered 2020-05-11: 16:00:00 40 mg via INTRAVENOUS

## 2020-05-11 MED ORDER — INSULIN ASPART 100 UNIT/ML SC FLEXPEN
0-6 [IU] | Freq: Before meals | SUBCUTANEOUS | 0 refills | Status: DC
Start: 2020-05-11 — End: 2020-05-15

## 2020-05-11 MED ORDER — FUROSEMIDE 10 MG/ML IJ SOLN
60 mg | Freq: Once | INTRAVENOUS | 0 refills | Status: CP
Start: 2020-05-11 — End: ?
  Administered 2020-05-11: 21:00:00 60 mg via INTRAVENOUS

## 2020-05-11 MED ORDER — GABAPENTIN 300 MG PO CAP
300 mg | Freq: Two times a day (BID) | ORAL | 0 refills | Status: DC
Start: 2020-05-11 — End: 2020-05-13
  Administered 2020-05-12 – 2020-05-13 (×4): 300 mg via ORAL

## 2020-05-11 MED ORDER — METOPROLOL TARTRATE 5 MG/5 ML IV SOLN
5 mg | INTRAVENOUS | 0 refills | Status: DC | PRN
Start: 2020-05-11 — End: 2020-05-22
  Administered 2020-05-11 – 2020-05-17 (×5): 5 mg via INTRAVENOUS

## 2020-05-11 MED ADMIN — SODIUM CHLORIDE 0.9 % IV SOLP [27838]: 1000 mL | INTRAVENOUS | @ 11:00:00 | Stop: 2020-05-11 | NDC 00338004904

## 2020-05-12 ENCOUNTER — Inpatient Hospital Stay: Admit: 2020-05-12 | Discharge: 2020-05-12 | Payer: MEDICARE

## 2020-05-12 ENCOUNTER — Encounter: Admit: 2020-05-12 | Discharge: 2020-05-12 | Payer: MEDICARE

## 2020-05-12 MED ORDER — FUROSEMIDE 10 MG/ML IJ SOLN
80 mg | Freq: Once | INTRAVENOUS | 0 refills | Status: CP
Start: 2020-05-12 — End: ?
  Administered 2020-05-12: 15:00:00 80 mg via INTRAVENOUS

## 2020-05-12 MED ORDER — FUROSEMIDE 10 MG/ML IJ SOLN
80 mg | Freq: Once | INTRAVENOUS | 0 refills | Status: CP
Start: 2020-05-12 — End: ?
  Administered 2020-05-12: 23:00:00 80 mg via INTRAVENOUS

## 2020-05-12 MED ORDER — LACTATED RINGERS IV SOLP
500 mL | INTRAVENOUS | 0 refills | Status: CP
Start: 2020-05-12 — End: ?
  Administered 2020-05-12: 22:00:00 500 mL via INTRAVENOUS

## 2020-05-12 MED ORDER — POTASSIUM CHLORIDE 20 MEQ PO TBTQ
20 meq | Freq: Once | ORAL | 0 refills | Status: CP
Start: 2020-05-12 — End: ?
  Administered 2020-05-12: 22:00:00 20 meq via ORAL

## 2020-05-12 MED ORDER — FUROSEMIDE 10 MG/ML IJ SOLN
80 mg | Freq: Once | INTRAVENOUS | 0 refills | Status: DC
Start: 2020-05-12 — End: 2020-05-12

## 2020-05-13 ENCOUNTER — Encounter: Admit: 2020-05-13 | Discharge: 2020-05-13 | Payer: MEDICARE

## 2020-05-13 ENCOUNTER — Inpatient Hospital Stay: Admit: 2020-05-13 | Discharge: 2020-05-13 | Payer: MEDICARE

## 2020-05-13 MED ORDER — IPRATROPIUM-ALBUTEROL 0.5 MG-3 MG(2.5 MG BASE)/3 ML IN NEBU
3 mL | RESPIRATORY_TRACT | 0 refills | Status: DC | PRN
Start: 2020-05-13 — End: 2020-06-11
  Administered 2020-05-28: 13:00:00 3 mL via RESPIRATORY_TRACT

## 2020-05-13 MED ORDER — SODIUM CHLORIDE 0.9 % IV SOLP (OR) 500ML
INTRAVENOUS | 0 refills | Status: DC
Start: 2020-05-13 — End: 2020-05-13
  Administered 2020-05-13: 22:00:00 via INTRAVENOUS

## 2020-05-13 MED ORDER — GABAPENTIN 100 MG PO CAP
100 mg | Freq: Two times a day (BID) | ORAL | 0 refills | Status: DC
Start: 2020-05-13 — End: 2020-05-19
  Administered 2020-05-14 – 2020-05-16 (×6): 100 mg via ORAL

## 2020-05-13 MED ORDER — FUROSEMIDE 10 MG/ML IJ SOLN
100 mg | Freq: Once | INTRAVENOUS | 0 refills | Status: CP
Start: 2020-05-13 — End: ?
  Administered 2020-05-13: 12:00:00 100 mg via INTRAVENOUS

## 2020-05-13 MED ORDER — ONDANSETRON HCL (PF) 4 MG/2 ML IJ SOLN
4 mg | INTRAVENOUS | 0 refills | Status: DC | PRN
Start: 2020-05-13 — End: 2020-05-18
  Administered 2020-05-14 – 2020-05-16 (×3): 4 mg via INTRAVENOUS

## 2020-05-13 MED ORDER — METOPROLOL TARTRATE 25 MG PO TAB
12.5 mg | Freq: Two times a day (BID) | ORAL | 0 refills | Status: DC
Start: 2020-05-13 — End: 2020-05-13

## 2020-05-13 MED ORDER — DEXMEDETOMIDINE# 4MCG/ML IV SOLN
INTRAVENOUS | 0 refills | Status: DC
Start: 2020-05-13 — End: 2020-05-13
  Administered 2020-05-13: 22:00:00 8 ug via INTRAVENOUS
  Administered 2020-05-13: 22:00:00 4 ug via INTRAVENOUS

## 2020-05-13 MED ORDER — LINEZOLID IN DEXTROSE 5% 600 MG/300 ML IV PGBK
600 mg | Freq: Two times a day (BID) | INTRAVENOUS | 0 refills | Status: DC
Start: 2020-05-13 — End: 2020-05-17
  Administered 2020-05-13 – 2020-05-17 (×9): 600 mg via INTRAVENOUS

## 2020-05-13 MED ORDER — SENNOSIDES 8.8 MG/5 ML PO SYRP
17.6 mg | Freq: Two times a day (BID) | ORAL | 0 refills | Status: DC
Start: 2020-05-13 — End: 2020-05-15
  Administered 2020-05-14 – 2020-05-15 (×4): 17.6 mg via ORAL

## 2020-05-13 MED ORDER — SODIUM BICARBONATE 650 MG PO TAB
650 mg | Freq: Two times a day (BID) | ORAL | 0 refills | Status: DC
Start: 2020-05-13 — End: 2020-05-14
  Administered 2020-05-13 – 2020-05-14 (×2): 650 mg via ORAL

## 2020-05-13 MED ORDER — BUPIVACAINE HCL 0.25 % (2.5 MG/ML) IJ SOLN
0 refills | Status: DC
Start: 2020-05-13 — End: 2020-05-13
  Administered 2020-05-13: 23:00:00 10 mL via INTRAMUSCULAR

## 2020-05-13 MED ORDER — PEG-ELECTROLYTE SOLN 420 GRAM PO SOLR
4 L | ORAL | 0 refills | Status: DC
Start: 2020-05-13 — End: 2020-05-14
  Administered 2020-05-14: 02:00:00 4 L via ORAL

## 2020-05-13 MED ORDER — FENTANYL CITRATE (PF) 50 MCG/ML IJ SOLN
25-50 ug | Freq: Once | INTRAVENOUS | 0 refills | Status: DC
Start: 2020-05-13 — End: 2020-05-13

## 2020-05-13 MED ORDER — PEG-ELECTROLYTE SOLN 420 GRAM PO SOLR
2 L | ORAL | 0 refills | Status: DC | PRN
Start: 2020-05-13 — End: 2020-05-15

## 2020-05-13 MED ORDER — BISACODYL 5 MG PO TBEC
10 mg | Freq: Once | ORAL | 0 refills | Status: AC | PRN
Start: 2020-05-13 — End: ?

## 2020-05-13 NOTE — Anesthesia Post-Procedure Evaluation
Post-Anesthesia Evaluation    Name: Madison Johnston      MRN: [id number]     DOB: 1946/11/12     Age: 74 y.o.     Sex: female   __________________________________________________________________________     Procedure Information     Anesthesia Start Date/Time: 05/13/20 1655    Procedure: INCISION AND DRAINAGE ISCHIORECTAL/ PERIRECTAL ABSCESS POSSIBLE IRRIGATION AND DEBRIDEMENT (N/A Perineum) - MAC okay per Dr. Merleen Milliner    Location: MAIN OR 11 / Main OR/Periop    Surgeons: Guinevere Scarlet, MD          Post-Anesthesia Vitals      Vitals Value Taken Time   BP 101/63 05/13/20 1753   Temp     Pulse 116 05/13/20 1755   Respirations 17 PER MINUTE 05/13/20 1755   SpO2 98 % 05/13/20 1754   Vitals shown include unvalidated device data.      Post Anesthesia Evaluation Note    Evaluation location: ICU  Patient participation: recovered; patient participated in evaluation  Level of consciousness: alert  Pain management: adequate    Hydration: normovolemia  Temperature: 36.0?C - 38.4?C  Airway patency: adequate    Perioperative Events       Post-op nausea and vomiting: no PONV    Postoperative Status  Cardiovascular status: hemodynamically stable  Respiratory status: spontaneous ventilation  ICU Information          Staff involved in transport include: CRNA, anesthesiologist and OR nurse      Perioperative Events  Perioperative Event: No  Emergency Case Activation: No

## 2020-05-13 NOTE — Anesthesia Pre-Procedure Evaluation
Anesthesia Pre-Procedure Evaluation    Name: Madison Johnston      MRN: [id number]     DOB: [birth year]     Age: 74 y.o.     Sex: female   __________________________________________________________________________     Procedure Date: 07/08/2015   Procedure: Procedure(s) with comments:  Left hip aspiration     Physical Assessment  Vital Signs (last filed in past 24 hours):  BP: 111/76 (05/25 1600)  Temp: 36.4 ?C (97.5 ?F) (05/25 1200)  Pulse: 113 (05/25 1600)  Respirations: 17 PER MINUTE (05/25 1600)  SpO2: 96 % (05/25 1600)  Weight: 120.5 kg (265 lb 10.5 oz) (05/25 0500)      Patient History  No Known Allergies     Current Medications    Medication Directions   allopurinol (ZYLOPRIM) 100 mg tablet Take 1 Tab by mouth daily.  Patient taking differently: Take 100 mg by mouth at bedtime daily.   apixaban (ELIQUIS) 5 mg tablet Take 5 mg by mouth twice daily.   calcium carbonate (OS-CAL) 1250 mg tablet Take 1,250 mg by mouth twice daily.   cephalexin (KEFLEX) 500 mg capsule Take 4 capsules by mouth one hour before dental procedure. Use remaining capsules for future dental procedures.   cetirizine (ZYRTEC) 10 mg tablet Take 1 Tab by mouth daily as needed for Allergy symptoms.   cholecalciferol (VITAMIN D-3) 1,000 units tablet Take 2,000 Units by mouth daily.   esomeprazole DR (NEXIUM) 20 mg capsule Take 20 mg by mouth every morning.   fish oil /omega-3 fatty acids (SEA-OMEGA) 340/1000 mg capsule Take 2 Caps by mouth daily.   fluticasone (FLONASE) 50 mcg/actuation nasal spray Apply 2 Sprays to each nostril as directed daily as needed.   folic acid (FOLVITE) 1 mg tablet Take 1 Tab by mouth daily.   gabapentin (NEURONTIN) 400 mg capsule Take 400 mg by mouth twice daily.   leflunomide (ARAVA) 20 mg tablet Take 20 mg by mouth daily.   metFORMIN (GLUCOPHAGE) 500 mg tablet Take 1 Tab by mouth twice daily with meals.   metoprolol XL (TOPROL XL) 100 mg extended release tablet Take one tablet by mouth daily.   potassium chloride SR (K-DUR) 20 mEq tablet TAKE 1 TABLET BY MOUTH TWICE DAILY WITH MEALS AND A FULL GLASS OF WATER   senna (SENOKOT) 8.6 mg tablet Take 3 tablets by mouth at bedtime daily.   simvastatin (ZOCOR) 80 mg tablet Take 1 Tab by mouth at bedtime daily.   torsemide (DEMADEX) 20 mg tablet Take one tablet by mouth daily.   vitamins, multiple tablet Take 1 Tab by mouth daily.       Scheduled Meds:Continuous Infusions:  PRN and Respiratory Meds:  Medical History:   Diagnosis Date   ? Arthritis    ? Carpal tunnel syndrome    ? DM (diabetes mellitus) (HCC)    ? Essential hypertension    ? GERD (gastroesophageal reflux disease)    ? Hyperlipidemia    ? Immune disorder (HCC)     RA   ? 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 Osteoarthritis    ? Osteoporosis    ? Rheumatoid arthritis (HCC)    ? 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 Ulcer      Surgical History:   Procedure Laterality Date   ? COLOSTOMY  2012    with sigmoidectomy   ?  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 SALPINGO-OOPHORECTOMY  August 2012    Done while in OR with sigmoidectomy from abscess adhesion   ? HUMERUS FRACTURE SURGERY Left 08/2013   ? LEFT PRIMARY TOTAL KNEE ARTHROPLASTY Left 07/08/2015    Performed by Simonne Maffucci, MD at [facility] OR   ? ARTHROCENTESIS/ ASPIRATION/ INJECTION MAJOR JOINT/ BURSA Left 05/09/2020    Performed by Heddings, Revonda Standard, MD at [facility] OR   ? HX APPENDECTOMY  74 yrs old   ? HX CARPAL TUNNEL RELEASE Bilateral    ? HX KNEE SURGERY Right     total knee arthroplasty   ? HX SHOULDER SURGERY Right     9 pins, plate, and bone graft     Social History     Tobacco Use   ? Smoking status: Never Smoker   ? Smokeless tobacco: Never Used   Substance Use Topics   ? Alcohol use: No         Review of Systems/Medical History      Patient summary reviewed  Nursing notes reviewed  Pertinent labs reviewed    PONV Screening: Female gender, Non-smoker and Postoperative opioids  No history of anesthetic complications  No family history of anesthetic complications        Pulmonary       Not a current smoker        Cardiovascular       Recent diagnostic studies:          ECG       Beta Blocker therapy: Yes      No hypertension (hypotensive on midodrine),         Dysrhythmias (with RVR); atrial fibrillation      CHF      Hyperlipidemia ( taking statin)      Activity: limited 2/2 knee pain; 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    Abdominal Findings:       Obese    Neurological Findings:       Normal mental status       Diagnostic Tests  Hematology:   Lab Results   Component Value Date    HGB 8.1 05/13/2020    HCT 25.0 05/13/2020    PLTCT 259 05/13/2020    WBC 18.9 05/13/2020    NEUT 87 05/11/2020    ANC 19.00 05/13/2020    ANC 14.40 05/11/2020    LYMPH 1 05/13/2020    ALC 0.82 05/11/2020    MONA 5 05/11/2020    AMC 0.88 05/11/2020    EOSA 3 05/11/2020    ABC 0.05 05/11/2020    BASOPHILS 1 08/05/2011    MCV 79.1 05/13/2020    MCH 25.8 05/13/2020    MCHC 32.6 05/13/2020    MPV 7.5 05/13/2020    RDW 17.9 05/13/2020         General Chemistry:   Lab Results   Component Value Date    NA 133 05/13/2020    K 3.9 05/13/2020    CL 99 05/13/2020    CO2 14 05/13/2020    GAP 20 05/13/2020    BUN 55 05/13/2020    CR 4.14 05/13/2020    GLU 107 05/13/2020    CA 7.2 05/13/2020    ALBUMIN 2.7 05/13/2020    LACTIC 1.8 08/24/2013    OBSCA 1.04 05/07/2020    MG 2.1 05/13/2020    TOTBILI 1.0 05/13/2020    PO4 6.5 05/13/2020      Coagulation:   Lab Results   Component Value Date    PTT 35.4 05/13/2020    INR 1.8 05/13/2020     Labs: cbc, bmp, t&S  EKG: SR      2D echo 07/28/11  1. Technically difficult study due to poor acoustic windows.   2. Normal left ventricular systolic function with an EF of 65%.  3. No regional wall motion abnormalities.  4. Prominent basal septum with no LVOT obstruction or SAM. Normal diastolic function. Normal left atrial pressure.  5. Normal right ventricular size and function.  6. MAC with some restriction of the posterior mitral valve leaflet. There was not significant mitral valve stenosis, the mean mitral valve gradient was 2.3 mmHg. There was no significant MR.   7. Unable to calculate peak PAP due to no significant TR.  8. Bubble study was technically difficult, but appeared to demonstrate right to left shunting with Valsalva suggestive of PFO or ASD.   Evaluated by MAC in 06/2014:  RCRI Class 1 peri-operative risk with < 0.4% risk of major peri-op CV event. No additional cardiac diagnostic studies indicated at this time      Anesthesia Plan    ASA score: 4   Plan: MAC  Induction method: intravenous  NPO status: acceptable      Informed Consent  Anesthetic plan and risks discussed with patient.  Use of blood products discussed with patient      Plan discussed with: resident.

## 2020-05-14 ENCOUNTER — Encounter: Admit: 2020-05-14 | Discharge: 2020-05-14 | Payer: MEDICARE

## 2020-05-14 ENCOUNTER — Inpatient Hospital Stay: Admit: 2020-05-14 | Discharge: 2020-05-14 | Payer: MEDICARE

## 2020-05-14 DIAGNOSIS — K921 Melena: Secondary | ICD-10-CM

## 2020-05-14 MED ORDER — LIDOCAINE 5 % TP PTMD
1 | Freq: Every day | TOPICAL | 0 refills | Status: DC
Start: 2020-05-14 — End: 2020-05-19
  Administered 2020-05-14 – 2020-05-18 (×3): 1 via TOPICAL

## 2020-05-14 MED ORDER — FUROSEMIDE IVPB (100 MG - 120 MG DOSES)
120 mg | Freq: Once | INTRAVENOUS | 0 refills | Status: CP
Start: 2020-05-14 — End: ?
  Administered 2020-05-14 (×2): 120 mg via INTRAVENOUS

## 2020-05-14 MED ORDER — FENTANYL CITRATE (PF) 50 MCG/ML IJ SOLN
50 ug | Freq: Once | INTRAVENOUS | 0 refills | Status: CP
Start: 2020-05-14 — End: ?

## 2020-05-14 MED ORDER — LIDOCAINE 5 % TP PTMD
1 | Freq: Every day | TOPICAL | 0 refills | Status: DC
Start: 2020-05-14 — End: 2020-05-14

## 2020-05-14 MED ORDER — SODIUM BICARBONATE 650 MG PO TAB
1300 mg | Freq: Two times a day (BID) | NASOGASTRIC | 0 refills | Status: DC
Start: 2020-05-14 — End: 2020-05-16
  Administered 2020-05-14 – 2020-05-16 (×5): 1300 mg via NASOGASTRIC

## 2020-05-14 MED ORDER — OXYCODONE 5 MG/5 ML PO SOLN
10 mg | Freq: Once | ORAL | 0 refills | Status: CP
Start: 2020-05-14 — End: ?
  Administered 2020-05-14: 10:00:00 10 mg via ORAL

## 2020-05-14 MED ORDER — PANTOPRAZOLE 40 MG IV SOLR
40 mg | Freq: Two times a day (BID) | INTRAVENOUS | 0 refills | Status: DC
Start: 2020-05-14 — End: 2020-05-15
  Administered 2020-05-14 – 2020-05-15 (×2): 40 mg via INTRAVENOUS

## 2020-05-14 MED ORDER — PROCHLORPERAZINE EDISYLATE 5 MG/ML IJ SOLN
10 mg | INTRAVENOUS | 0 refills | Status: DC | PRN
Start: 2020-05-14 — End: 2020-05-18
  Administered 2020-05-18: 04:00:00 10 mg via INTRAVENOUS

## 2020-05-14 MED ORDER — FUROSEMIDE IVPB (100 MG - 120 MG DOSES)
120 mg | Freq: Once | INTRAVENOUS | 0 refills | Status: CP
Start: 2020-05-14 — End: ?
  Administered 2020-05-14 (×2): 120 mg via INTRAVENOUS

## 2020-05-14 MED ORDER — POTASSIUM CHLORIDE IN WATER 10 MEQ/50 ML IV PGBK
10 meq | INTRAVENOUS | 0 refills | Status: CP
Start: 2020-05-14 — End: ?
  Administered 2020-05-14: 12:00:00 10 meq via INTRAVENOUS

## 2020-05-14 MED ORDER — METOLAZONE 5 MG PO TAB
5 mg | Freq: Once | ORAL | 0 refills | Status: CP
Start: 2020-05-14 — End: ?
  Administered 2020-05-14: 21:00:00 5 mg via ORAL

## 2020-05-14 MED ORDER — POTASSIUM CHLORIDE IN WATER 10 MEQ/50 ML IV PGBK
10 meq | INTRAVENOUS | 0 refills | Status: CP
Start: 2020-05-14 — End: ?
  Administered 2020-05-14: 14:00:00 10 meq via INTRAVENOUS

## 2020-05-14 MED ORDER — POTASSIUM CHLORIDE IN WATER 10 MEQ/50 ML IV PGBK
10 meq | INTRAVENOUS | 0 refills | Status: CP
Start: 2020-05-14 — End: ?
  Administered 2020-05-14: 10:00:00 10 meq via INTRAVENOUS

## 2020-05-14 MED ADMIN — ONDANSETRON HCL (PF) 4 MG/2 ML IJ SOLN [136012]: 4 mg | INTRAVENOUS | @ 04:00:00 | Stop: 2020-05-14 | NDC 36000001225

## 2020-05-14 MED ADMIN — PROCHLORPERAZINE EDISYLATE 5 MG/ML IJ SOLN [6580]: 10 mg | INTRAVENOUS | @ 08:00:00 | Stop: 2020-05-14 | NDC 23155029431

## 2020-05-14 MED ADMIN — FENTANYL CITRATE (PF) 50 MCG/ML IJ SOLN [3037]: 50 ug | INTRAVENOUS | @ 08:00:00 | Stop: 2020-05-14 | NDC 00409909412

## 2020-05-15 ENCOUNTER — Encounter: Admit: 2020-05-15 | Discharge: 2020-05-15 | Payer: MEDICARE

## 2020-05-15 ENCOUNTER — Ambulatory Visit: Admit: 2020-05-15 | Discharge: 2020-05-15 | Payer: MEDICARE

## 2020-05-15 DIAGNOSIS — M1990 Unspecified osteoarthritis, unspecified site: Secondary | ICD-10-CM

## 2020-05-15 DIAGNOSIS — I280 Arteriovenous fistula of pulmonary vessels: Secondary | ICD-10-CM

## 2020-05-15 DIAGNOSIS — Z006 Encounter for examination for normal comparison and control in clinical research program: Secondary | ICD-10-CM

## 2020-05-15 DIAGNOSIS — K219 Gastro-esophageal reflux disease without esophagitis: Secondary | ICD-10-CM

## 2020-05-15 DIAGNOSIS — E119 Type 2 diabetes mellitus without complications: Secondary | ICD-10-CM

## 2020-05-15 DIAGNOSIS — M810 Age-related osteoporosis without current pathological fracture: Secondary | ICD-10-CM

## 2020-05-15 DIAGNOSIS — IMO0002 Ulcer: Secondary | ICD-10-CM

## 2020-05-15 DIAGNOSIS — G5600 Carpal tunnel syndrome, unspecified upper limb: Secondary | ICD-10-CM

## 2020-05-15 DIAGNOSIS — K921 Melena: Secondary | ICD-10-CM

## 2020-05-15 DIAGNOSIS — M069 Rheumatoid arthritis, unspecified: Secondary | ICD-10-CM

## 2020-05-15 DIAGNOSIS — D899 Disorder involving the immune mechanism, unspecified: Secondary | ICD-10-CM

## 2020-05-15 DIAGNOSIS — E785 Hyperlipidemia, unspecified: Secondary | ICD-10-CM

## 2020-05-15 MED ORDER — MIDODRINE 5 MG PO TAB
10 mg | Freq: Three times a day (TID) | ORAL | 0 refills | Status: DC
Start: 2020-05-15 — End: 2020-05-16
  Administered 2020-05-15 – 2020-05-16 (×4): 10 mg via ORAL

## 2020-05-15 MED ORDER — FUROSEMIDE IVPB (100 MG - 120 MG DOSES)
120 mg | Freq: Two times a day (BID) | INTRAVENOUS | 0 refills | Status: DC
Start: 2020-05-15 — End: 2020-05-16
  Administered 2020-05-15 (×4): 120 mg via INTRAVENOUS

## 2020-05-15 MED ORDER — SENNOSIDES 8.6 MG PO TAB
2 | Freq: Two times a day (BID) | ORAL | 0 refills | Status: DC
Start: 2020-05-15 — End: 2020-05-19
  Administered 2020-05-16 (×2): 2 via ORAL

## 2020-05-15 MED ORDER — PANTOPRAZOLE 40 MG PO TBEC
40 mg | Freq: Every day | ORAL | 0 refills | Status: DC
Start: 2020-05-15 — End: 2020-05-16
  Administered 2020-05-16: 02:00:00 40 mg via ORAL

## 2020-05-15 MED ORDER — PANTOPRAZOLE 40 MG IV SOLR
40 mg | Freq: Every day | INTRAVENOUS | 0 refills | Status: DC
Start: 2020-05-15 — End: 2020-05-15

## 2020-05-15 MED ORDER — SEVELAMER CARBONATE 800 MG PO TAB
1600 mg | Freq: Three times a day (TID) | ORAL | 0 refills | Status: DC
Start: 2020-05-15 — End: 2020-05-16
  Administered 2020-05-15 – 2020-05-16 (×2): 1600 mg via ORAL

## 2020-05-16 ENCOUNTER — Inpatient Hospital Stay: Admit: 2020-05-16 | Discharge: 2020-05-16 | Payer: MEDICARE

## 2020-05-16 ENCOUNTER — Encounter: Admit: 2020-05-16 | Discharge: 2020-05-16 | Payer: MEDICARE

## 2020-05-16 DIAGNOSIS — I280 Arteriovenous fistula of pulmonary vessels: Secondary | ICD-10-CM

## 2020-05-16 DIAGNOSIS — E785 Hyperlipidemia, unspecified: Secondary | ICD-10-CM

## 2020-05-16 DIAGNOSIS — IMO0002 Ulcer: Secondary | ICD-10-CM

## 2020-05-16 DIAGNOSIS — M069 Rheumatoid arthritis, unspecified: Secondary | ICD-10-CM

## 2020-05-16 DIAGNOSIS — E119 Type 2 diabetes mellitus without complications: Secondary | ICD-10-CM

## 2020-05-16 DIAGNOSIS — D899 Disorder involving the immune mechanism, unspecified: Secondary | ICD-10-CM

## 2020-05-16 DIAGNOSIS — G5600 Carpal tunnel syndrome, unspecified upper limb: Secondary | ICD-10-CM

## 2020-05-16 DIAGNOSIS — M810 Age-related osteoporosis without current pathological fracture: Secondary | ICD-10-CM

## 2020-05-16 DIAGNOSIS — K219 Gastro-esophageal reflux disease without esophagitis: Secondary | ICD-10-CM

## 2020-05-16 DIAGNOSIS — M1990 Unspecified osteoarthritis, unspecified site: Secondary | ICD-10-CM

## 2020-05-16 MED ORDER — SODIUM CHLORIDE 0.9 % IV SOLP
2000 mL | INTRAVENOUS | 0 refills | Status: DC | PRN
Start: 2020-05-16 — End: 2020-05-16

## 2020-05-16 MED ORDER — MIDODRINE 5 MG PO TAB
10 mg | Freq: Three times a day (TID) | GASTROSTOMY | 0 refills | Status: DC
Start: 2020-05-16 — End: 2020-05-19
  Administered 2020-05-17 – 2020-05-18 (×5): 10 mg via GASTROSTOMY

## 2020-05-16 MED ORDER — PANTOPRAZOLE 40 MG IV SOLR
40 mg | Freq: Every day | INTRAVENOUS | 0 refills | Status: DC
Start: 2020-05-16 — End: 2020-05-21
  Administered 2020-05-17 – 2020-05-21 (×5): 40 mg via INTRAVENOUS

## 2020-05-16 MED ORDER — SODIUM BICARBONATE 1 MEQ/ML (8.4 %) IV SOLN
100 meq | Freq: Once | INTRAVENOUS | 0 refills | Status: CP
Start: 2020-05-16 — End: ?
  Administered 2020-05-16: 16:00:00 100 meq via INTRAVENOUS

## 2020-05-16 MED ORDER — VANCOMYCIN 25 MG/ML PO SOLR
125 mg | Freq: Four times a day (QID) | ORAL | 0 refills | Status: DC
Start: 2020-05-16 — End: 2020-05-20
  Administered 2020-05-16 – 2020-05-20 (×15): 125 mg via ORAL

## 2020-05-16 MED ORDER — SODIUM CHLORIDE 0.9 % IV SOLP
300 mL | INTRAVENOUS | 0 refills | Status: CP | PRN
Start: 2020-05-16 — End: ?
  Administered 2020-05-16: 20:00:00 300 mL via INTRAVENOUS

## 2020-05-16 MED ORDER — PIPERACILLIN/TAZOBACTAM 2.25 G/100ML NS IVPB (MB+)
2.25 g | INTRAVENOUS | 0 refills | Status: DC
Start: 2020-05-16 — End: 2020-05-17
  Administered 2020-05-17 (×3): 2.25 g via INTRAVENOUS

## 2020-05-16 MED ORDER — SODIUM BICARBONATE 650 MG PO TAB
1300 mg | Freq: Two times a day (BID) | ORAL | 0 refills | Status: DC
Start: 2020-05-16 — End: 2020-05-17
  Administered 2020-05-17: 02:00:00 1300 mg via ORAL

## 2020-05-16 MED ORDER — ACETAMINOPHEN 160 MG/5 ML PO SOLN
650 mg | GASTROSTOMY | 0 refills | Status: DC | PRN
Start: 2020-05-16 — End: 2020-05-26
  Administered 2020-05-17 – 2020-05-19 (×7): 650 mg via GASTROSTOMY

## 2020-05-16 MED ORDER — LEFLUNOMIDE 10 MG PO TAB
20 mg | Freq: Every day | GASTROSTOMY | 0 refills | Status: DC
Start: 2020-05-16 — End: 2020-05-23
  Administered 2020-05-17 – 2020-05-22 (×6): 20 mg via GASTROSTOMY

## 2020-05-16 MED ORDER — ACETAMINOPHEN 325 MG PO TAB
650 mg | GASTROSTOMY | 0 refills | Status: DC | PRN
Start: 2020-05-16 — End: 2020-05-17

## 2020-05-16 MED ORDER — SODIUM CHLORIDE 0.9 % IV SOLP
200 mL | INTRAVENOUS | 0 refills | Status: DC | PRN
Start: 2020-05-16 — End: 2020-05-16

## 2020-05-16 MED ORDER — SODIUM BICARBONATE 650 MG PO TAB
1950 mg | Freq: Two times a day (BID) | ORAL | 0 refills | Status: DC
Start: 2020-05-16 — End: 2020-05-16

## 2020-05-16 MED ADMIN — SODIUM CITRATE 4 % (3 ML) IK SYRG [301113]: 3.2 mL | INTRAVENOUS_CENTRAL | @ 20:00:00 | Stop: 2020-05-16 | NDC 70092114843

## 2020-05-17 ENCOUNTER — Inpatient Hospital Stay: Admit: 2020-05-17 | Discharge: 2020-05-17 | Payer: MEDICARE

## 2020-05-17 MED ORDER — PANCRELIPASE-SODIUM BICARBONATE 20,880 K UNIT-650 MG
NASOGASTRIC | 0 refills | Status: DC | PRN
Start: 2020-05-17 — End: 2020-05-22

## 2020-05-17 MED ORDER — SODIUM BICARBONATE 650 MG PO TAB
1300 mg | Freq: Three times a day (TID) | ORAL | 0 refills | Status: DC
Start: 2020-05-17 — End: 2020-05-17
  Administered 2020-05-17: 15:00:00 1300 mg via ORAL

## 2020-05-17 MED ORDER — SODIUM CHLORIDE 0.9 % IV SOLP
200 mL | INTRAVENOUS | 0 refills | Status: DC | PRN
Start: 2020-05-17 — End: 2020-05-17

## 2020-05-17 MED ORDER — SODIUM BICARBONATE 650 MG PO TAB
1300 mg | Freq: Three times a day (TID) | ORAL | 0 refills | Status: DC
Start: 2020-05-17 — End: 2020-05-18
  Administered 2020-05-17 – 2020-05-18 (×2): 1300 mg via ORAL

## 2020-05-17 MED ORDER — FUROSEMIDE IVPB (100 MG - 120 MG DOSES)
120 mg | Freq: Once | INTRAVENOUS | 0 refills | Status: CP
Start: 2020-05-17 — End: ?
  Administered 2020-05-18 (×2): 120 mg via INTRAVENOUS

## 2020-05-17 MED ORDER — SODIUM BICARBONATE 650 MG PO TAB
650 mg | Freq: Three times a day (TID) | ORAL | 0 refills | Status: DC
Start: 2020-05-17 — End: 2020-05-17

## 2020-05-17 MED ORDER — LINEZOLID IN DEXTROSE 5% 600 MG/300 ML IV PGBK
600 mg | Freq: Two times a day (BID) | INTRAVENOUS | 0 refills | Status: CP
Start: 2020-05-17 — End: ?
  Administered 2020-05-18 – 2020-05-21 (×7): 600 mg via INTRAVENOUS

## 2020-05-17 MED ORDER — SODIUM CHLORIDE 0.9 % IV SOLP
2000 mL | INTRAVENOUS | 0 refills | Status: DC | PRN
Start: 2020-05-17 — End: 2020-05-17

## 2020-05-17 MED ORDER — SODIUM CITRATE 4 % (3 ML) IK SYRG
1-6 mL | 0 refills | Status: CP | PRN
Start: 2020-05-17 — End: ?

## 2020-05-17 MED ORDER — SODIUM CHLORIDE 0.9 % IV SOLP
300 mL | INTRAVENOUS | 0 refills | Status: CP | PRN
Start: 2020-05-17 — End: ?
  Administered 2020-05-17: 18:00:00 300 mL via INTRAVENOUS

## 2020-05-17 MED ADMIN — SODIUM CITRATE 4 % (3 ML) IK SYRG [301113]: 3.2 mL | @ 18:00:00 | Stop: 2020-05-17 | NDC 70092114843

## 2020-05-18 MED ORDER — B COMP NO3-FOLIC-C-BIOTIN-ZINC 1-60-300-12.5 MG-MG-MCG-MG PO TAB
1 | Freq: Every day | NASOGASTRIC | 0 refills | Status: DC
Start: 2020-05-18 — End: 2020-06-11
  Administered 2020-05-18 – 2020-06-11 (×25): 1 via NASOGASTRIC

## 2020-05-18 MED ORDER — SODIUM CITRATE 4 % (3 ML) IK SYRG
1-6 mL | 0 refills | Status: CP | PRN
Start: 2020-05-18 — End: ?
  Administered 2020-05-18: 16:00:00 3 mL

## 2020-05-18 MED ORDER — HEPARIN (PORCINE) 10,000 UNIT/ML IJ SOLN (IMS)
7500 [IU] | SUBCUTANEOUS | 0 refills | Status: CP
Start: 2020-05-18 — End: ?
  Administered 2020-05-18 – 2020-05-21 (×10): 7500 [IU] via SUBCUTANEOUS

## 2020-05-18 MED ORDER — SODIUM CHLORIDE 0.9 % IV SOLP
200 mL | INTRAVENOUS | 0 refills | Status: AC | PRN
Start: 2020-05-18 — End: ?

## 2020-05-18 MED ORDER — SODIUM CHLORIDE 0.9 % IV SOLP
2000 mL | INTRAVENOUS | 0 refills | Status: AC | PRN
Start: 2020-05-18 — End: ?

## 2020-05-18 MED ORDER — ALBUMIN, HUMAN 25 % IV SOLP
25 g | Freq: Once | INTRAVENOUS | 0 refills | Status: CP
Start: 2020-05-18 — End: ?
  Administered 2020-05-18: 16:00:00 25 g via INTRAVENOUS

## 2020-05-18 MED ORDER — HEPARIN, PORCINE (PF) 5,000 UNIT/0.5 ML IJ SYRG
5000 [IU] | SUBCUTANEOUS | 0 refills | Status: DC
Start: 2020-05-18 — End: 2020-05-18

## 2020-05-18 MED ORDER — SODIUM CHLORIDE 0.9 % IV SOLP
300 mL | INTRAVENOUS | 0 refills | Status: CP | PRN
Start: 2020-05-18 — End: ?
  Administered 2020-05-18: 16:00:00 300 mL via INTRAVENOUS

## 2020-05-18 MED ORDER — PROCHLORPERAZINE EDISYLATE 5 MG/ML IJ SOLN
5 mg | INTRAVENOUS | 0 refills | Status: DC | PRN
Start: 2020-05-18 — End: 2020-05-22
  Administered 2020-05-19 (×3): 5 mg via INTRAVENOUS

## 2020-05-18 MED ADMIN — PROCHLORPERAZINE EDISYLATE 5 MG/ML IJ SOLN [6580]: 5 mg | INTRAVENOUS | @ 23:00:00 | Stop: 2020-05-18 | NDC 23155029431

## 2020-05-19 MED ORDER — QUETIAPINE 100 MG PO TAB
100 mg | Freq: Every evening | ORAL | 0 refills | Status: DC
Start: 2020-05-19 — End: 2020-05-20
  Administered 2020-05-20: 02:00:00 100 mg via ORAL

## 2020-05-19 MED ORDER — SODIUM CITRATE 4 % (3 ML) IK SYRG
1-6 mL | 0 refills | Status: CP | PRN
Start: 2020-05-19 — End: ?

## 2020-05-19 MED ORDER — SODIUM CHLORIDE 0.9 % IV SOLP
300 mL | INTRAVENOUS | 0 refills | Status: CP | PRN
Start: 2020-05-19 — End: ?
  Administered 2020-05-19: 13:00:00 300 mL via INTRAVENOUS

## 2020-05-19 MED ORDER — METOPROLOL(#) 10MG/ML PO SOLN
25 mg | Freq: Two times a day (BID) | NASOGASTRIC | 0 refills | Status: DC
Start: 2020-05-19 — End: 2020-05-20
  Administered 2020-05-20 (×2): 25 mg via NASOGASTRIC

## 2020-05-19 MED ORDER — GABAPENTIN 100 MG PO CAP
200 mg | Freq: Two times a day (BID) | ORAL | 0 refills | Status: DC
Start: 2020-05-19 — End: 2020-05-20
  Administered 2020-05-19 – 2020-05-20 (×2): 200 mg via ORAL

## 2020-05-19 MED ORDER — TRAZODONE 50 MG PO TAB
50 mg | Freq: Every evening | ORAL | 0 refills | Status: DC
Start: 2020-05-19 — End: 2020-05-20
  Administered 2020-05-20: 02:00:00 50 mg via ORAL

## 2020-05-19 MED ORDER — MIDODRINE 5 MG PO TAB
10 mg | Freq: Three times a day (TID) | GASTROSTOMY | 0 refills | Status: DC
Start: 2020-05-19 — End: 2020-05-21
  Administered 2020-05-19 – 2020-05-21 (×7): 10 mg via GASTROSTOMY

## 2020-05-19 MED ORDER — METOPROLOL(#) 10MG/ML PO SOLN
12.5 mg | Freq: Two times a day (BID) | NASOGASTRIC | 0 refills | Status: DC
Start: 2020-05-19 — End: 2020-05-19
  Administered 2020-05-19: 15:00:00 12.5 mg via NASOGASTRIC

## 2020-05-19 MED ORDER — SODIUM CHLORIDE 0.9 % IV SOLP
2000 mL | INTRAVENOUS | 0 refills | Status: DC | PRN
Start: 2020-05-19 — End: 2020-05-19

## 2020-05-19 MED ORDER — RISPERIDONE 1 MG PO TAB
1 mg | Freq: Every evening | NASOGASTRIC | 0 refills | Status: DC
Start: 2020-05-19 — End: 2020-05-19

## 2020-05-19 MED ORDER — POTASSIUM CHLORIDE 20 MEQ/15 ML PO LIQD
60 meq | Freq: Once | NASOGASTRIC | 0 refills | Status: CP
Start: 2020-05-19 — End: ?
  Administered 2020-05-19: 20:00:00 60 meq via NASOGASTRIC

## 2020-05-19 MED ORDER — SODIUM CHLORIDE 0.9 % IV SOLP
200 mL | INTRAVENOUS | 0 refills | Status: DC | PRN
Start: 2020-05-19 — End: 2020-05-19

## 2020-05-19 MED ORDER — LIDOCAINE 5 % TP PTMD
1 | Freq: Every day | TOPICAL | 0 refills | Status: DC | PRN
Start: 2020-05-19 — End: 2020-06-11
  Administered 2020-05-19 – 2020-06-07 (×3): 1 via TOPICAL

## 2020-05-19 MED ORDER — HALOPERIDOL LACTATE 5 MG/ML IJ SOLN
.5 mg | Freq: Once | INTRAVENOUS | 0 refills | Status: CP
Start: 2020-05-19 — End: ?
  Administered 2020-05-19: 22:00:00 0.5 mg via INTRAVENOUS

## 2020-05-19 MED ORDER — MELATONIN 3 MG PO TAB
3 mg | Freq: Every evening | ORAL | 0 refills | Status: DC
Start: 2020-05-19 — End: 2020-05-20
  Administered 2020-05-20: 02:00:00 3 mg via ORAL

## 2020-05-19 MED ORDER — POTASSIUM CHLORIDE 20 MEQ/15 ML PO LIQD
40 meq | Freq: Once | NASOGASTRIC | 0 refills | Status: CP
Start: 2020-05-19 — End: ?
  Administered 2020-05-19: 10:00:00 40 meq via NASOGASTRIC

## 2020-05-19 MED ORDER — RISPERIDONE 1 MG PO TAB
1 mg | Freq: Once | ORAL | 0 refills | Status: CP
Start: 2020-05-19 — End: ?
  Administered 2020-05-19: 19:00:00 1 mg via ORAL

## 2020-05-19 MED ORDER — HALOPERIDOL LACTATE 5 MG/ML IJ SOLN
2.5 mg | INTRAVENOUS | 0 refills | Status: DC | PRN
Start: 2020-05-19 — End: 2020-05-20
  Administered 2020-05-19: 22:00:00 2.5 mg via INTRAVENOUS

## 2020-05-19 MED ADMIN — SODIUM CITRATE 4 % (3 ML) IK SYRG [301113]: 3.2 mL | @ 13:00:00 | Stop: 2020-05-19 | NDC 70092114843

## 2020-05-20 MED ORDER — GABAPENTIN 100 MG PO CAP
200 mg | Freq: Every day | ORAL | 0 refills | Status: DC
Start: 2020-05-20 — End: 2020-05-20

## 2020-05-20 MED ORDER — QUETIAPINE 25 MG PO TAB
50 mg | Freq: Every evening | GASTROSTOMY | 0 refills | Status: DC
Start: 2020-05-20 — End: 2020-05-21
  Administered 2020-05-21: 01:00:00 50 mg via GASTROSTOMY

## 2020-05-20 MED ORDER — FUROSEMIDE 10 MG/ML IJ SOLN
40 mg | Freq: Once | INTRAVENOUS | 0 refills | Status: CP
Start: 2020-05-20 — End: ?
  Administered 2020-05-20: 40 mg via INTRAVENOUS

## 2020-05-20 MED ORDER — GABAPENTIN 100 MG PO CAP
200 mg | Freq: Every day | GASTROSTOMY | 0 refills | Status: DC
Start: 2020-05-20 — End: 2020-05-21

## 2020-05-20 MED ORDER — INSULIN ASPART 100 UNIT/ML SC FLEXPEN
0-6 [IU] | Freq: Before meals | SUBCUTANEOUS | 0 refills | Status: DC
Start: 2020-05-20 — End: 2020-05-21
  Administered 2020-05-20: 16:00:00 1 [IU] via SUBCUTANEOUS

## 2020-05-20 MED ORDER — VANCOMYCIN 25 MG/ML PO SOLR
125 mg | Freq: Four times a day (QID) | GASTROSTOMY | 0 refills | Status: DC
Start: 2020-05-20 — End: 2020-05-30
  Administered 2020-05-20 – 2020-05-29 (×38): 125 mg via GASTROSTOMY

## 2020-05-20 MED ORDER — METOPROLOL(#) 10MG/ML PO SOLN
12.5 mg | Freq: Two times a day (BID) | NASOGASTRIC | 0 refills | Status: DC
Start: 2020-05-20 — End: 2020-05-20

## 2020-05-20 MED ORDER — MELATONIN 3 MG PO TAB
3 mg | Freq: Every evening | GASTROSTOMY | 0 refills | Status: DC
Start: 2020-05-20 — End: 2020-05-21

## 2020-05-20 MED ORDER — METOPROLOL(#) 10MG/ML PO SOLN
25 mg | Freq: Two times a day (BID) | NASOGASTRIC | 0 refills | Status: DC
Start: 2020-05-20 — End: 2020-05-22
  Administered 2020-05-21 – 2020-05-22 (×4): 25 mg via NASOGASTRIC

## 2020-05-20 MED ORDER — QUETIAPINE 25 MG PO TAB
50 mg | Freq: Every evening | ORAL | 0 refills | Status: DC
Start: 2020-05-20 — End: 2020-05-20

## 2020-05-20 MED ORDER — POTASSIUM CHLORIDE 20 MEQ/15 ML PO LIQD
40 meq | Freq: Once | NASOGASTRIC | 0 refills | Status: CP
Start: 2020-05-20 — End: ?
  Administered 2020-05-21: 01:00:00 40 meq via NASOGASTRIC

## 2020-05-21 ENCOUNTER — Encounter: Admit: 2020-05-21 | Discharge: 2020-05-21 | Payer: MEDICARE

## 2020-05-21 ENCOUNTER — Inpatient Hospital Stay: Admit: 2020-05-21 | Discharge: 2020-05-21 | Payer: MEDICARE

## 2020-05-21 MED ORDER — PANTOPRAZOLE(#) 2MG/ML PO SUSP
40 mg | Freq: Every day | GASTROSTOMY | 0 refills | Status: DC
Start: 2020-05-21 — End: 2020-05-30
  Administered 2020-05-21 – 2020-05-29 (×9): 40 mg via GASTROSTOMY

## 2020-05-21 MED ORDER — DOCUSATE SODIUM 50 MG/5 ML PO LIQD
100 mg | Freq: Two times a day (BID) | GASTROSTOMY | 0 refills | Status: DC
Start: 2020-05-21 — End: 2020-05-30
  Administered 2020-05-24 – 2020-05-29 (×9): 100 mg via GASTROSTOMY

## 2020-05-21 MED ORDER — POTASSIUM CHLORIDE 20 MEQ/15 ML PO LIQD
60 meq | Freq: Once | GASTROSTOMY | 0 refills | Status: CP
Start: 2020-05-21 — End: ?
  Administered 2020-05-21: 23:00:00 60 meq via GASTROSTOMY

## 2020-05-21 MED ORDER — APIXABAN 5 MG PO TAB
5 mg | Freq: Two times a day (BID) | NASOGASTRIC | 0 refills | Status: DC
Start: 2020-05-21 — End: 2020-05-22
  Administered 2020-05-22 (×2): 5 mg via NASOGASTRIC

## 2020-05-21 MED ORDER — POTASSIUM CHLORIDE IN WATER 10 MEQ/50 ML IV PGBK
10 meq | INTRAVENOUS | 0 refills | Status: CP
Start: 2020-05-21 — End: ?
  Administered 2020-05-21: 14:00:00 10 meq via INTRAVENOUS

## 2020-05-21 MED ORDER — MAGNESIUM SULFATE IN D5W 1 GRAM/100 ML IV PGBK
1 g | INTRAVENOUS | 0 refills | Status: CP
Start: 2020-05-21 — End: ?
  Administered 2020-05-22: 01:00:00 1 g via INTRAVENOUS

## 2020-05-21 MED ORDER — MELATONIN 3 MG PO TAB
3 mg | Freq: Every evening | GASTROSTOMY | 0 refills | Status: DC | PRN
Start: 2020-05-21 — End: 2020-05-22

## 2020-05-21 MED ORDER — FUROSEMIDE 10 MG/ML IJ SOLN
40 mg | Freq: Once | INTRAVENOUS | 0 refills | Status: CP
Start: 2020-05-21 — End: ?
  Administered 2020-05-21: 14:00:00 40 mg via INTRAVENOUS

## 2020-05-21 MED ORDER — APIXABAN 5 MG PO TAB
2.5 mg | Freq: Two times a day (BID) | ORAL | 0 refills | Status: DC
Start: 2020-05-21 — End: 2020-05-21

## 2020-05-21 MED ORDER — POTASSIUM CHLORIDE IN WATER 10 MEQ/50 ML IV PGBK
10 meq | INTRAVENOUS | 0 refills | Status: CP
Start: 2020-05-21 — End: ?

## 2020-05-21 MED ORDER — POTASSIUM CHLORIDE IN WATER 10 MEQ/50 ML IV PGBK
10 meq | INTRAVENOUS | 0 refills | Status: CP
Start: 2020-05-21 — End: ?
  Administered 2020-05-21: 19:00:00 10 meq via INTRAVENOUS

## 2020-05-21 MED ORDER — INSULIN ASPART 100 UNIT/ML SC FLEXPEN
0-12 [IU] | Freq: Before meals | SUBCUTANEOUS | 0 refills | Status: DC
Start: 2020-05-21 — End: 2020-06-11

## 2020-05-21 MED ORDER — MIDODRINE 5 MG PO TAB
5 mg | Freq: Three times a day (TID) | GASTROSTOMY | 0 refills | Status: DC
Start: 2020-05-21 — End: 2020-05-23
  Administered 2020-05-21 – 2020-05-23 (×6): 5 mg via GASTROSTOMY

## 2020-05-21 MED ORDER — POTASSIUM CHLORIDE IN WATER 10 MEQ/50 ML IV PGBK
10 meq | INTRAVENOUS | 0 refills | Status: CP
Start: 2020-05-21 — End: ?
  Administered 2020-05-21: 17:00:00 10 meq via INTRAVENOUS

## 2020-05-21 MED ORDER — POTASSIUM CHLORIDE 20 MEQ/15 ML PO LIQD
40 meq | Freq: Once | NASOGASTRIC | 0 refills | Status: CP
Start: 2020-05-21 — End: ?
  Administered 2020-05-21: 10:00:00 40 meq via NASOGASTRIC

## 2020-05-21 MED ORDER — MAGNESIUM SULFATE IN D5W 1 GRAM/100 ML IV PGBK
1 g | INTRAVENOUS | 0 refills | Status: CP
Start: 2020-05-21 — End: ?
  Administered 2020-05-21: 14:00:00 1 g via INTRAVENOUS

## 2020-05-21 MED ORDER — MAGNESIUM SULFATE IN D5W 1 GRAM/100 ML IV PGBK
1 g | INTRAVENOUS | 0 refills | Status: CP
Start: 2020-05-21 — End: ?
  Administered 2020-05-21: 17:00:00 1 g via INTRAVENOUS

## 2020-05-22 ENCOUNTER — Encounter: Admit: 2020-05-22 | Discharge: 2020-05-22 | Payer: MEDICARE

## 2020-05-22 ENCOUNTER — Inpatient Hospital Stay: Admit: 2020-05-22 | Discharge: 2020-05-22 | Payer: MEDICARE

## 2020-05-22 MED ORDER — METOPROLOL(#) 10MG/ML PO SOLN
25 mg | Freq: Once | GASTROSTOMY | 0 refills | Status: CP
Start: 2020-05-22 — End: ?
  Administered 2020-05-22: 15:00:00 25 mg via GASTROSTOMY

## 2020-05-22 MED ORDER — METOPROLOL(#) 10MG/ML PO SOLN
50 mg | Freq: Two times a day (BID) | GASTROSTOMY | 0 refills | Status: DC
Start: 2020-05-22 — End: 2020-05-26
  Administered 2020-05-23 – 2020-05-26 (×8): 50 mg via GASTROSTOMY

## 2020-05-22 MED ORDER — IOHEXOL 350 MG IODINE/ML IV SOLN
60 mL | Freq: Once | INTRAVENOUS | 0 refills | Status: CP
Start: 2020-05-22 — End: ?

## 2020-05-22 MED ORDER — SODIUM CHLORIDE 0.9 % IJ SOLN
50 mL | Freq: Once | INTRAVENOUS | 0 refills | Status: CP
Start: 2020-05-22 — End: ?
  Administered 2020-05-23: 01:00:00 50 mL via INTRAVENOUS

## 2020-05-22 MED ORDER — ACETAZOLAMIDE SODIUM 500 MG IJ SOLR
500 mg | Freq: Once | INTRAVENOUS | 0 refills | Status: CP
Start: 2020-05-22 — End: ?
  Administered 2020-05-23: 500 mg via INTRAVENOUS

## 2020-05-23 MED ORDER — POTASSIUM CHLORIDE IN WATER 10 MEQ/50 ML IV PGBK
10 meq | INTRAVENOUS | 0 refills | Status: CP
Start: 2020-05-23 — End: ?
  Administered 2020-05-23: 11:00:00 10 meq via INTRAVENOUS

## 2020-05-23 MED ORDER — ACETAZOLAMIDE SODIUM 500 MG IJ SOLR
500 mg | Freq: Once | INTRAVENOUS | 0 refills | Status: DC
Start: 2020-05-23 — End: 2020-05-23

## 2020-05-23 MED ORDER — MIDODRINE 5 MG PO TAB
5 mg | Freq: Two times a day (BID) | GASTROSTOMY | 0 refills | Status: DC
Start: 2020-05-23 — End: 2020-05-24
  Administered 2020-05-23 – 2020-05-24 (×3): 5 mg via GASTROSTOMY

## 2020-05-23 MED ORDER — MELATONIN 3 MG PO TAB
3 mg | Freq: Every evening | ORAL | 0 refills | Status: DC
Start: 2020-05-23 — End: 2020-05-29
  Administered 2020-05-24 – 2020-05-29 (×6): 3 mg via ORAL

## 2020-05-23 MED ORDER — POTASSIUM CHLORIDE IN WATER 10 MEQ/50 ML IV PGBK
10 meq | INTRAVENOUS | 0 refills | Status: CP
Start: 2020-05-23 — End: ?
  Administered 2020-05-23: 14:00:00 10 meq via INTRAVENOUS

## 2020-05-23 MED ORDER — MAGNESIUM SULFATE IN D5W 1 GRAM/100 ML IV PGBK
1 g | INTRAVENOUS | 0 refills | Status: CP
Start: 2020-05-23 — End: ?
  Administered 2020-05-23: 14:00:00 1 g via INTRAVENOUS

## 2020-05-23 MED ORDER — POTASSIUM CHLORIDE IN WATER 10 MEQ/50 ML IV PGBK
10 meq | INTRAVENOUS | 0 refills | Status: CP
Start: 2020-05-23 — End: ?
  Administered 2020-05-23: 16:00:00 10 meq via INTRAVENOUS

## 2020-05-24 MED ORDER — SODIUM CHLORIDE 0.9 % IV SOLP
300 mL | INTRAVENOUS | 0 refills | Status: DC | PRN
Start: 2020-05-24 — End: 2020-05-24

## 2020-05-24 MED ORDER — SODIUM CITRATE 4 % (3 ML) IK SYRG
1-6 mL | 0 refills | Status: DC | PRN
Start: 2020-05-24 — End: 2020-05-24

## 2020-05-24 MED ORDER — SODIUM CHLORIDE 0.9 % IV SOLP
200 mL | INTRAVENOUS | 0 refills | Status: DC | PRN
Start: 2020-05-24 — End: 2020-05-24

## 2020-05-24 MED ORDER — SODIUM CHLORIDE 0.9 % IV SOLP
2000 mL | INTRAVENOUS | 0 refills | Status: DC | PRN
Start: 2020-05-24 — End: 2020-05-24

## 2020-05-24 MED ORDER — POTASSIUM CHLORIDE 20 MEQ/15 ML PO LIQD
20 meq | Freq: Once | NASOGASTRIC | 0 refills | Status: CP
Start: 2020-05-24 — End: ?
  Administered 2020-05-24: 13:00:00 20 meq via NASOGASTRIC

## 2020-05-24 MED ORDER — MAGNESIUM SULFATE IN D5W 1 GRAM/100 ML IV PGBK
1 g | INTRAVENOUS | 0 refills | Status: CP
Start: 2020-05-24 — End: ?
  Administered 2020-05-24: 13:00:00 1 g via INTRAVENOUS

## 2020-05-25 ENCOUNTER — Encounter: Admit: 2020-05-25 | Discharge: 2020-05-25 | Payer: MEDICARE

## 2020-05-25 ENCOUNTER — Inpatient Hospital Stay: Admit: 2020-05-25 | Discharge: 2020-05-25 | Payer: MEDICARE

## 2020-05-25 MED ORDER — POTASSIUM CHLORIDE 20 MEQ/15 ML PO LIQD
40 meq | Freq: Once | NASOGASTRIC | 0 refills | Status: CP
Start: 2020-05-25 — End: ?
  Administered 2020-05-25: 13:00:00 40 meq via NASOGASTRIC

## 2020-05-25 MED ORDER — ACETAMINOPHEN 160 MG/5 ML PO SOLN
1000 mg | GASTROSTOMY | 0 refills | Status: DC | PRN
Start: 2020-05-25 — End: 2020-05-30
  Administered 2020-05-26 – 2020-05-29 (×7): 1000 mg via GASTROSTOMY

## 2020-05-25 MED ORDER — LINEZOLID IN DEXTROSE 5% 600 MG/300 ML IV PGBK
600 mg | Freq: Two times a day (BID) | INTRAVENOUS | 0 refills | Status: CP
Start: 2020-05-25 — End: ?
  Administered 2020-05-26 – 2020-05-30 (×11): 600 mg via INTRAVENOUS

## 2020-05-25 MED ORDER — MAGNESIUM SULFATE IN D5W 1 GRAM/100 ML IV PGBK
1 g | INTRAVENOUS | 0 refills | Status: CP
Start: 2020-05-25 — End: ?
  Administered 2020-05-25: 13:00:00 1 g via INTRAVENOUS

## 2020-05-25 MED ORDER — ACETAMINOPHEN 160 MG/5 ML PO SOLN
650 mg | Freq: Once | ORAL | 0 refills | Status: CP
Start: 2020-05-25 — End: ?
  Administered 2020-05-25: 15:00:00 650 mg via ORAL

## 2020-05-26 ENCOUNTER — Encounter: Admit: 2020-05-26 | Discharge: 2020-05-26 | Payer: MEDICARE

## 2020-05-26 ENCOUNTER — Inpatient Hospital Stay: Admit: 2020-05-26 | Discharge: 2020-05-26 | Payer: MEDICARE

## 2020-05-26 MED ORDER — PIPERACILLIN/TAZOBACTAM 3.375 G/100ML NS IVPB (MB+)
3.375 g | INTRAVENOUS | 0 refills | Status: DC
Start: 2020-05-26 — End: 2020-05-26
  Administered 2020-05-26 (×2): 3.375 g via INTRAVENOUS

## 2020-05-26 MED ORDER — FUROSEMIDE 10 MG/ML IJ SOLN
40 mg | Freq: Once | INTRAVENOUS | 0 refills | Status: CP
Start: 2020-05-26 — End: ?
  Administered 2020-05-26: 20:00:00 40 mg via INTRAVENOUS

## 2020-05-26 MED ORDER — METOPROLOL TARTRATE 5 MG/5 ML IV SOLN
5 mg | Freq: Once | INTRAVENOUS | 0 refills | Status: CP
Start: 2020-05-26 — End: ?
  Administered 2020-05-26: 20:00:00 5 mg via INTRAVENOUS

## 2020-05-26 MED ORDER — METOPROLOL(#) 10MG/ML PO SOLN
75 mg | Freq: Two times a day (BID) | GASTROSTOMY | 0 refills | Status: DC
Start: 2020-05-26 — End: 2020-05-26

## 2020-05-26 MED ORDER — METRONIDAZOLE IN NACL (ISO-OS) 500 MG/100 ML IV PGBK
500 mg | INTRAVENOUS | 0 refills | Status: CP
Start: 2020-05-26 — End: ?
  Administered 2020-05-26 – 2020-05-30 (×13): 500 mg via INTRAVENOUS

## 2020-05-26 MED ORDER — CEFEPIME 2G/100ML NS IVPB (MB+)
2 g | Freq: Two times a day (BID) | INTRAVENOUS | 0 refills | Status: CP
Start: 2020-05-26 — End: ?
  Administered 2020-05-27 – 2020-05-31 (×16): 2 g via INTRAVENOUS

## 2020-05-26 MED ORDER — POTASSIUM CHLORIDE 20 MEQ/15 ML PO LIQD
20 meq | Freq: Once | NASOGASTRIC | 0 refills | Status: CP
Start: 2020-05-26 — End: ?
  Administered 2020-05-27: 03:00:00 20 meq via NASOGASTRIC

## 2020-05-26 MED ORDER — METOPROLOL(#) 10MG/ML PO SOLN
150 mg | Freq: Two times a day (BID) | GASTROSTOMY | 0 refills | Status: DC
Start: 2020-05-26 — End: 2020-05-30
  Administered 2020-05-27 – 2020-05-29 (×5): 150 mg via GASTROSTOMY

## 2020-05-26 MED ORDER — METOPROLOL(#) 10MG/ML PO SOLN
75 mg | GASTROSTOMY | 0 refills | Status: DC
Start: 2020-05-26 — End: 2020-05-26

## 2020-05-26 MED ORDER — FENTANYL CITRATE (PF) 50 MCG/ML IJ SOLN
25 ug | Freq: Once | INTRAVENOUS | 0 refills | Status: CP
Start: 2020-05-26 — End: ?

## 2020-05-27 ENCOUNTER — Inpatient Hospital Stay: Admit: 2020-05-27 | Discharge: 2020-05-27 | Payer: MEDICARE

## 2020-05-27 MED ORDER — ENOXAPARIN 40 MG/0.4 ML SC SYRG
40 mg | Freq: Every day | SUBCUTANEOUS | 0 refills | Status: CP
Start: 2020-05-27 — End: ?
  Administered 2020-05-28 – 2020-05-30 (×3): 40 mg via SUBCUTANEOUS

## 2020-05-27 MED ORDER — POTASSIUM CHLORIDE 20 MEQ/15 ML PO LIQD
20 meq | Freq: Once | NASOGASTRIC | 0 refills | Status: CP
Start: 2020-05-27 — End: ?
  Administered 2020-05-27: 13:00:00 20 meq via NASOGASTRIC

## 2020-05-27 MED ORDER — MAGNESIUM SULFATE IN D5W 1 GRAM/100 ML IV PGBK
1 g | INTRAVENOUS | 0 refills | Status: CP
Start: 2020-05-27 — End: ?
  Administered 2020-05-27: 10:00:00 1 g via INTRAVENOUS

## 2020-05-27 MED ORDER — OXYCODONE 5 MG PO TAB
5 mg | ORAL | 0 refills | Status: DC | PRN
Start: 2020-05-27 — End: 2020-06-04
  Administered 2020-05-27 – 2020-06-04 (×8): 5 mg via ORAL

## 2020-05-27 MED ORDER — FUROSEMIDE 10 MG/ML IJ SOLN
40 mg | Freq: Once | INTRAVENOUS | 0 refills | Status: CP
Start: 2020-05-27 — End: ?
  Administered 2020-05-27: 16:00:00 40 mg via INTRAVENOUS

## 2020-05-28 ENCOUNTER — Inpatient Hospital Stay: Admit: 2020-05-28 | Discharge: 2020-05-28 | Payer: MEDICARE

## 2020-05-28 MED ORDER — POTASSIUM CHLORIDE IN WATER 10 MEQ/50 ML IV PGBK
10 meq | INTRAVENOUS | 0 refills | Status: CP
Start: 2020-05-28 — End: ?
  Administered 2020-05-29: 07:00:00 10 meq via INTRAVENOUS

## 2020-05-28 MED ORDER — FUROSEMIDE 10 MG/ML IJ SOLN
40 mg | Freq: Once | INTRAVENOUS | 0 refills | Status: CP
Start: 2020-05-28 — End: ?
  Administered 2020-05-28: 15:00:00 40 mg via INTRAVENOUS

## 2020-05-28 MED ORDER — ONDANSETRON HCL (PF) 4 MG/2 ML IJ SOLN
4 mg | INTRAVENOUS | 0 refills | Status: DC | PRN
Start: 2020-05-28 — End: 2020-06-11
  Administered 2020-05-29 – 2020-06-10 (×2): 4 mg via INTRAVENOUS

## 2020-05-28 MED ORDER — POTASSIUM CHLORIDE IN WATER 10 MEQ/50 ML IV PGBK
10 meq | INTRAVENOUS | 0 refills | Status: CP
Start: 2020-05-28 — End: ?
  Administered 2020-05-28: 13:00:00 10 meq via INTRAVENOUS

## 2020-05-28 MED ORDER — POTASSIUM CHLORIDE IN WATER 10 MEQ/50 ML IV PGBK
10 meq | INTRAVENOUS | 0 refills | Status: CP
Start: 2020-05-28 — End: ?
  Administered 2020-05-29: 06:00:00 10 meq via INTRAVENOUS

## 2020-05-28 MED ORDER — POTASSIUM CHLORIDE IN WATER 10 MEQ/50 ML IV PGBK
10 meq | INTRAVENOUS | 0 refills | Status: CP
Start: 2020-05-28 — End: ?
  Administered 2020-05-29: 05:00:00 10 meq via INTRAVENOUS

## 2020-05-28 MED ORDER — MAGNESIUM SULFATE IN D5W 1 GRAM/100 ML IV PGBK
1 g | INTRAVENOUS | 0 refills | Status: CP
Start: 2020-05-28 — End: ?
  Administered 2020-05-28: 13:00:00 1 g via INTRAVENOUS

## 2020-05-28 MED ORDER — POTASSIUM CHLORIDE IN WATER 10 MEQ/50 ML IV PGBK
10 meq | INTRAVENOUS | 0 refills | Status: CP
Start: 2020-05-28 — End: ?
  Administered 2020-05-28: 12:00:00 10 meq via INTRAVENOUS

## 2020-05-28 MED ORDER — POTASSIUM CHLORIDE IN WATER 10 MEQ/50 ML IV PGBK
10 meq | INTRAVENOUS | 0 refills | Status: CP
Start: 2020-05-28 — End: ?
  Administered 2020-05-28: 14:00:00 10 meq via INTRAVENOUS

## 2020-05-28 MED ORDER — MAGNESIUM SULFATE IN D5W 1 GRAM/100 ML IV PGBK
1 g | INTRAVENOUS | 0 refills | Status: CP
Start: 2020-05-28 — End: ?
  Administered 2020-05-28: 12:00:00 1 g via INTRAVENOUS

## 2020-05-28 MED ORDER — POTASSIUM CHLORIDE IN WATER 10 MEQ/50 ML IV PGBK
10 meq | INTRAVENOUS | 0 refills | Status: CP
Start: 2020-05-28 — End: ?
  Administered 2020-05-29: 01:00:00 10 meq via INTRAVENOUS

## 2020-05-28 MED ORDER — POTASSIUM CHLORIDE IN WATER 10 MEQ/50 ML IV PGBK
10 meq | INTRAVENOUS | 0 refills | Status: CP
Start: 2020-05-28 — End: ?
  Administered 2020-05-29: 04:00:00 10 meq via INTRAVENOUS

## 2020-05-28 MED ORDER — POTASSIUM CHLORIDE IN WATER 10 MEQ/50 ML IV PGBK
10 meq | INTRAVENOUS | 0 refills | Status: CP
Start: 2020-05-28 — End: ?
  Administered 2020-05-29: 03:00:00 10 meq via INTRAVENOUS

## 2020-05-28 MED ORDER — POTASSIUM CHLORIDE IN WATER 10 MEQ/50 ML IV PGBK
10 meq | INTRAVENOUS | 0 refills | Status: CP
Start: 2020-05-28 — End: ?

## 2020-05-28 MED ORDER — POTASSIUM CHLORIDE IN WATER 10 MEQ/50 ML IV PGBK
10 meq | INTRAVENOUS | 0 refills | Status: CP
Start: 2020-05-28 — End: ?
  Administered 2020-05-28: 15:00:00 10 meq via INTRAVENOUS

## 2020-05-28 MED ADMIN — ONDANSETRON HCL (PF) 4 MG/2 ML IJ SOLN [136012]: 4 mg | INTRAVENOUS | @ 05:00:00 | Stop: 2020-05-28 | NDC 36000001225

## 2020-05-29 MED ORDER — GABAPENTIN 100 MG PO CAP
100 mg | Freq: Two times a day (BID) | ORAL | 0 refills | Status: DC
Start: 2020-05-29 — End: 2020-06-11
  Administered 2020-05-29 – 2020-06-11 (×26): 100 mg via ORAL

## 2020-05-29 MED ORDER — DOCUSATE SODIUM 100 MG PO CAP
100 mg | Freq: Two times a day (BID) | ORAL | 0 refills | Status: DC
Start: 2020-05-29 — End: 2020-06-11
  Administered 2020-05-30 – 2020-06-11 (×24): 100 mg via ORAL

## 2020-05-29 MED ORDER — VANCOMYCIN 125 MG PO CAP
125 mg | Freq: Four times a day (QID) | ORAL | 0 refills | Status: DC
Start: 2020-05-29 — End: 2020-05-30

## 2020-05-29 MED ORDER — MELATONIN 5 MG PO TAB
5 mg | Freq: Every evening | ORAL | 0 refills | Status: DC
Start: 2020-05-29 — End: 2020-05-30
  Administered 2020-05-30: 02:00:00 5 mg via ORAL

## 2020-05-29 MED ORDER — PANTOPRAZOLE 40 MG PO TBEC
40 mg | Freq: Every day | ORAL | 0 refills | Status: DC
Start: 2020-05-29 — End: 2020-06-11
  Administered 2020-06-01 – 2020-06-11 (×9): 40 mg via ORAL

## 2020-05-29 MED ORDER — METOPROLOL(#) 10MG/ML PO SOLN
150 mg | Freq: Two times a day (BID) | ORAL | 0 refills | Status: DC
Start: 2020-05-29 — End: 2020-05-30

## 2020-05-29 MED ORDER — POTASSIUM CHLORIDE 20 MEQ/15 ML PO LIQD
60 meq | Freq: Once | ORAL | 0 refills | Status: CP
Start: 2020-05-29 — End: ?
  Administered 2020-05-29: 11:00:00 60 meq via ORAL

## 2020-05-29 MED ORDER — VANCOMYCIN 25 MG/ML PO SOLR
125 mg | Freq: Four times a day (QID) | GASTROSTOMY | 0 refills | Status: CP
Start: 2020-05-29 — End: ?
  Administered 2020-05-30 (×4): 125 mg via GASTROSTOMY

## 2020-05-29 MED ORDER — TRAZODONE 50 MG PO TAB
25 mg | Freq: Every evening | ORAL | 0 refills | Status: DC | PRN
Start: 2020-05-29 — End: 2020-05-30
  Administered 2020-05-30: 02:00:00 25 mg via ORAL

## 2020-05-29 MED ORDER — ACETAMINOPHEN 500 MG PO TAB
1000 mg | ORAL | 0 refills | Status: DC | PRN
Start: 2020-05-29 — End: 2020-06-11
  Administered 2020-05-30 – 2020-06-11 (×19): 1000 mg via ORAL

## 2020-05-29 MED ORDER — POTASSIUM CHLORIDE 20 MEQ PO TBTQ
20 meq | Freq: Once | ORAL | 0 refills | Status: CP
Start: 2020-05-29 — End: ?
  Administered 2020-05-29: 22:00:00 20 meq via ORAL

## 2020-05-29 MED ORDER — PANTOPRAZOLE(#) 2MG/ML PO SUSP
40 mg | Freq: Every day | ORAL | 0 refills | Status: DC
Start: 2020-05-29 — End: 2020-05-30

## 2020-05-29 MED ORDER — FUROSEMIDE 10 MG/ML IJ SOLN
40 mg | Freq: Once | INTRAVENOUS | 0 refills | Status: CP
Start: 2020-05-29 — End: ?
  Administered 2020-05-29: 11:00:00 40 mg via INTRAVENOUS

## 2020-05-29 MED ORDER — METOPROLOL TARTRATE 50 MG PO TAB
150 mg | Freq: Two times a day (BID) | ORAL | 0 refills | Status: DC
Start: 2020-05-29 — End: 2020-05-30
  Administered 2020-05-30: 03:00:00 150 mg via ORAL

## 2020-05-29 MED ORDER — APIXABAN 5 MG PO TAB
5 mg | Freq: Two times a day (BID) | ORAL | 0 refills | Status: DC
Start: 2020-05-29 — End: 2020-06-11
  Administered 2020-05-30 – 2020-06-11 (×22): 5 mg via ORAL

## 2020-05-30 MED ORDER — METOPROLOL TARTRATE 50 MG PO TAB
100 mg | Freq: Two times a day (BID) | ORAL | 0 refills | Status: DC
Start: 2020-05-30 — End: 2020-06-11
  Administered 2020-05-30 – 2020-06-11 (×21): 100 mg via ORAL

## 2020-05-30 MED ORDER — MELATONIN 5 MG PO TAB
10 mg | Freq: Every evening | ORAL | 0 refills | Status: DC
Start: 2020-05-30 — End: 2020-06-01
  Administered 2020-05-31: 01:00:00 10 mg via ORAL

## 2020-06-01 ENCOUNTER — Inpatient Hospital Stay: Admit: 2020-06-01 | Discharge: 2020-06-01 | Payer: MEDICARE

## 2020-06-01 ENCOUNTER — Encounter: Admit: 2020-06-01 | Discharge: 2020-06-01 | Payer: MEDICARE

## 2020-06-01 MED ORDER — MELATONIN 5 MG PO TAB
5 mg | Freq: Every evening | ORAL | 0 refills | Status: DC | PRN
Start: 2020-06-01 — End: 2020-06-11
  Administered 2020-06-04 – 2020-06-11 (×7): 5 mg via ORAL

## 2020-06-02 MED ORDER — SIMVASTATIN 40 MG PO TAB
40 mg | Freq: Every evening | ORAL | 0 refills | Status: DC
Start: 2020-06-02 — End: 2020-06-11
  Administered 2020-06-03 – 2020-06-11 (×9): 40 mg via ORAL

## 2020-06-04 MED ORDER — MAGNESIUM SULFATE IN D5W 1 GRAM/100 ML IV PGBK
1 g | INTRAVENOUS | 0 refills | Status: CP
Start: 2020-06-04 — End: ?
  Administered 2020-06-04: 23:00:00 1 g via INTRAVENOUS

## 2020-06-04 MED ORDER — OXYCODONE 5 MG PO TAB
5 mg | ORAL | 0 refills | Status: DC | PRN
Start: 2020-06-04 — End: 2020-06-11
  Administered 2020-06-05 – 2020-06-11 (×14): 5 mg via ORAL

## 2020-06-04 MED ORDER — MAGNESIUM SULFATE IN D5W 1 GRAM/100 ML IV PGBK
1 g | INTRAVENOUS | 0 refills | Status: CP
Start: 2020-06-04 — End: ?
  Administered 2020-06-05: 03:00:00 1 g via INTRAVENOUS

## 2020-06-05 MED ORDER — POTASSIUM, SODIUM PHOSPHATES 280-160-250 MG PO PWPK
2 | Freq: Once | ORAL | 0 refills | Status: CP
Start: 2020-06-05 — End: ?
  Administered 2020-06-05: 22:00:00 500 mg via ORAL

## 2020-06-06 ENCOUNTER — Encounter: Admit: 2020-06-06 | Discharge: 2020-06-06 | Payer: MEDICARE

## 2020-06-06 ENCOUNTER — Inpatient Hospital Stay: Admit: 2020-06-06 | Discharge: 2020-06-06 | Payer: MEDICARE

## 2020-06-06 MED ORDER — FENTANYL CITRATE (PF) 50 MCG/ML IJ SOLN
50 ug | Freq: Once | INTRAVENOUS | 0 refills | Status: AC | PRN
Start: 2020-06-06 — End: ?

## 2020-06-06 MED ORDER — HEPARIN (PORCINE) 1,000 UNIT/NS 1000 ML SOLP (EP)(MICRA SHEATH)
0 refills | Status: DC
Start: 2020-06-06 — End: 2020-06-11
  Administered 2020-06-06 (×2): 1000 mL

## 2020-06-06 MED ORDER — SUGAMMADEX 100 MG/ML IV SOLN
INTRAVENOUS | 0 refills | Status: DC
Start: 2020-06-06 — End: 2020-06-06
  Administered 2020-06-06: 20:00:00 220 mg via INTRAVENOUS

## 2020-06-06 MED ORDER — HEPARIN (PORCINE) 1,000 UNIT/NS 1000 ML SOLP (EP)(MICRA DEVICE)
0 refills | Status: DC
Start: 2020-06-06 — End: 2020-06-11
  Administered 2020-06-06 (×2): 450 mL

## 2020-06-06 MED ORDER — FENTANYL CITRATE (PF) 50 MCG/ML IJ SOLN
25 ug | INTRAVENOUS | 0 refills | Status: DC | PRN
Start: 2020-06-06 — End: 2020-06-11

## 2020-06-06 MED ORDER — ARTIFICIAL TEARS SINGLE DOSE DROPS GROUP
OPHTHALMIC | 0 refills | Status: DC
Start: 2020-06-06 — End: 2020-06-06
  Administered 2020-06-06: 18:00:00 2 [drp] via OPHTHALMIC

## 2020-06-06 MED ORDER — BUPIVACAINE HCL 0.25 % (2.5 MG/ML) IJ SOLN
0 refills | Status: DC
Start: 2020-06-06 — End: 2020-06-11
  Administered 2020-06-06: 19:00:00 10 mL

## 2020-06-06 MED ORDER — IOPAMIDOL 61 % 50 ML IV SOLN (OT)(OSM)
0 refills | Status: DC
Start: 2020-06-06 — End: 2020-06-11
  Administered 2020-06-06: 20:00:00 35 mL via INTRAVENOUS

## 2020-06-06 MED ORDER — PHENYLEPHRINE HCL IN 0.9% NACL 1 MG/10 ML (100 MCG/ML) IV SYRG
INTRAVENOUS | 0 refills | Status: DC
Start: 2020-06-06 — End: 2020-06-06
  Administered 2020-06-06: 18:00:00 200 ug via INTRAVENOUS
  Administered 2020-06-06: 18:00:00 100 ug via INTRAVENOUS
  Administered 2020-06-06: 18:00:00 200 ug via INTRAVENOUS

## 2020-06-06 MED ORDER — IODIXANOL 320 MG IODINE/ML IV SOLN
0 refills | Status: DC
Start: 2020-06-06 — End: 2020-06-11
  Administered 2020-06-06: 20:00:00 29 mL via INTRAVENOUS

## 2020-06-06 MED ORDER — HYDROCODONE-ACETAMINOPHEN 5-325 MG PO TAB
1 | ORAL | 0 refills | Status: DC | PRN
Start: 2020-06-06 — End: 2020-06-07
  Administered 2020-06-07: 02:00:00 1 via ORAL

## 2020-06-06 MED ORDER — FENTANYL CITRATE (PF) 50 MCG/ML IJ SOLN
INTRAVENOUS | 0 refills | Status: DC
Start: 2020-06-06 — End: 2020-06-06
  Administered 2020-06-06: 18:00:00 100 ug via INTRAVENOUS

## 2020-06-06 MED ORDER — PROPOFOL INJ 10 MG/ML IV VIAL
INTRAVENOUS | 0 refills | Status: DC
Start: 2020-06-06 — End: 2020-06-06
  Administered 2020-06-06: 18:00:00 80 mg via INTRAVENOUS

## 2020-06-06 MED ORDER — ELECTROLYTE-A IV SOLP
INTRAVENOUS | 0 refills | Status: DC
Start: 2020-06-06 — End: 2020-06-06
  Administered 2020-06-06: 18:00:00 via INTRAVENOUS

## 2020-06-06 MED ORDER — PHENYLEPHRINE 40 MCG/ML IN NS IV DRIP (STD CONC)
INTRAVENOUS | 0 refills | Status: DC
Start: 2020-06-06 — End: 2020-06-06
  Administered 2020-06-06 (×2): .5 ug/kg/min via INTRAVENOUS

## 2020-06-06 MED ORDER — HEPARIN (PORCINE) 1,000 UNIT/ML IJ SOLN
INTRAVENOUS | 0 refills | Status: DC
Start: 2020-06-06 — End: 2020-06-06
  Administered 2020-06-06: 19:00:00 3000 [IU] via INTRAVENOUS

## 2020-06-06 MED ORDER — LIDOCAINE (PF) 20 MG/ML (2 %) IJ SOLN
INTRAVENOUS | 0 refills | Status: DC
Start: 2020-06-06 — End: 2020-06-06
  Administered 2020-06-06: 18:00:00 80 mg via INTRAVENOUS

## 2020-06-06 MED ORDER — DEXAMETHASONE SODIUM PHOSPHATE 4 MG/ML IJ SOLN
INTRAVENOUS | 0 refills | Status: DC
Start: 2020-06-06 — End: 2020-06-06
  Administered 2020-06-06: 18:00:00 4 mg via INTRAVENOUS

## 2020-06-06 MED ORDER — CEFAZOLIN INJ 1GM IVP
2 g | Freq: Once | INTRAVENOUS | 0 refills | Status: CP
Start: 2020-06-06 — End: ?
  Administered 2020-06-06: 18:00:00 2 g via INTRAVENOUS

## 2020-06-06 MED ORDER — HALOPERIDOL LACTATE 5 MG/ML IJ SOLN
INTRAVENOUS | 0 refills | Status: DC
Start: 2020-06-06 — End: 2020-06-06
  Administered 2020-06-06: 20:00:00 1 mg via INTRAVENOUS

## 2020-06-06 MED ORDER — ROCURONIUM 10 MG/ML IV SOLN
INTRAVENOUS | 0 refills | Status: DC
Start: 2020-06-06 — End: 2020-06-06
  Administered 2020-06-06: 18:00:00 40 mg via INTRAVENOUS

## 2020-06-06 MED ORDER — HALOPERIDOL LACTATE 5 MG/ML IJ SOLN
1 mg | Freq: Once | INTRAVENOUS | 0 refills | Status: DC | PRN
Start: 2020-06-06 — End: 2020-06-06

## 2020-06-06 NOTE — Anesthesia Post-Procedure Evaluation
Post-Anesthesia Evaluation    Name: Madison Johnston      MRN: [id number]     DOB: Jan 23, 1946     Age: 74 y.o.     Sex: female   __________________________________________________________________________     Procedure Information     Anesthesia Start Date/Time: 06/06/20 1256    Procedure: TRANSCATHETER INSERTION/ REPLACEMENT RIGHT VENTRICULAR PERMANENT LEADLESS PACEMAKER WITH/ WITHOUT?IMAGE GUIDANCE/ DEVICE EVALUATION (N/A )    Location: CV LAB 04 / Cath Lab    Providers: Jen Mow, MD          Post-Anesthesia Vitals  BP: 99/66 (06/18 1545)  Temp: 36.4 ?C (97.6 ?F) (06/18 1508)  Pulse: 94 (06/18 1545)  Respirations: 19 PER MINUTE (06/18 1545)  SpO2: 94 % (06/18 1545)  SpO2 Pulse: 92 (06/18 1545)   Vitals Value Taken Time   BP 99/66 06/06/20 1545   Temp 36.4 ?C (97.6 ?F) 06/06/20 1508   Pulse 94 06/06/20 1545   Respirations 19 PER MINUTE 06/06/20 1545   SpO2 94 % 06/06/20 1545         Post Anesthesia Evaluation Note    Evaluation location: Pre/Post  Patient participation: recovered; patient participated in evaluation  Level of consciousness: alert  Pain management: adequate    Hydration: normovolemia  Temperature: 36.0?C - 38.4?C  Airway patency: adequate    Perioperative Events       Post-op nausea and vomiting: no PONV    Postoperative Status  Cardiovascular status: hemodynamically stable  Respiratory status: spontaneous ventilation  Follow-up needed: none        Perioperative Events  Perioperative Event: No  Emergency Case Activation: No

## 2020-06-07 ENCOUNTER — Inpatient Hospital Stay: Admit: 2020-06-07 | Discharge: 2020-06-07 | Payer: MEDICARE

## 2020-06-09 ENCOUNTER — Inpatient Hospital Stay: Admit: 2020-06-09 | Discharge: 2020-06-09 | Payer: MEDICARE

## 2020-06-09 ENCOUNTER — Encounter: Admit: 2020-06-09 | Discharge: 2020-06-09 | Payer: MEDICARE

## 2020-06-09 DIAGNOSIS — M1990 Unspecified osteoarthritis, unspecified site: Secondary | ICD-10-CM

## 2020-06-09 DIAGNOSIS — IMO0002 Ulcer: Secondary | ICD-10-CM

## 2020-06-09 DIAGNOSIS — E119 Type 2 diabetes mellitus without complications: Secondary | ICD-10-CM

## 2020-06-09 DIAGNOSIS — D899 Disorder involving the immune mechanism, unspecified: Secondary | ICD-10-CM

## 2020-06-09 DIAGNOSIS — I280 Arteriovenous fistula of pulmonary vessels: Secondary | ICD-10-CM

## 2020-06-09 DIAGNOSIS — K219 Gastro-esophageal reflux disease without esophagitis: Secondary | ICD-10-CM

## 2020-06-09 DIAGNOSIS — E785 Hyperlipidemia, unspecified: Secondary | ICD-10-CM

## 2020-06-09 DIAGNOSIS — G5600 Carpal tunnel syndrome, unspecified upper limb: Secondary | ICD-10-CM

## 2020-06-09 DIAGNOSIS — M069 Rheumatoid arthritis, unspecified: Secondary | ICD-10-CM

## 2020-06-09 DIAGNOSIS — M810 Age-related osteoporosis without current pathological fracture: Secondary | ICD-10-CM

## 2020-06-10 ENCOUNTER — Encounter: Admit: 2020-06-10 | Discharge: 2020-06-10 | Payer: MEDICARE

## 2020-06-10 DIAGNOSIS — E785 Hyperlipidemia, unspecified: Secondary | ICD-10-CM

## 2020-06-10 DIAGNOSIS — E119 Type 2 diabetes mellitus without complications: Secondary | ICD-10-CM

## 2020-06-10 DIAGNOSIS — M810 Age-related osteoporosis without current pathological fracture: Secondary | ICD-10-CM

## 2020-06-10 DIAGNOSIS — D899 Disorder involving the immune mechanism, unspecified: Secondary | ICD-10-CM

## 2020-06-10 DIAGNOSIS — K219 Gastro-esophageal reflux disease without esophagitis: Secondary | ICD-10-CM

## 2020-06-10 DIAGNOSIS — IMO0002 Ulcer: Secondary | ICD-10-CM

## 2020-06-10 DIAGNOSIS — M1990 Unspecified osteoarthritis, unspecified site: Secondary | ICD-10-CM

## 2020-06-10 DIAGNOSIS — G5600 Carpal tunnel syndrome, unspecified upper limb: Secondary | ICD-10-CM

## 2020-06-10 DIAGNOSIS — M069 Rheumatoid arthritis, unspecified: Secondary | ICD-10-CM

## 2020-06-10 DIAGNOSIS — I280 Arteriovenous fistula of pulmonary vessels: Secondary | ICD-10-CM

## 2020-06-11 MED ORDER — ACETAMINOPHEN 500 MG PO TAB
1000 mg | ORAL | 0 refills | Status: AC | PRN
Start: 2020-06-11 — End: ?

## 2020-06-11 MED ORDER — PANTOPRAZOLE 40 MG PO TBEC
40 mg | ORAL_TABLET | Freq: Every day | ORAL | 0 refills | 90.00000 days | Status: AC
Start: 2020-06-11 — End: ?

## 2020-06-11 MED ORDER — SODIUM CHLORIDE 0.9 % IV SOLP
500 mL | INTRAVENOUS | 0 refills | Status: CP
Start: 2020-06-11 — End: ?
  Administered 2020-06-11: 10:00:00 500 mL via INTRAVENOUS

## 2020-06-11 MED ORDER — NYSTATIN 100,000 UNIT/GRAM TP POWD
TOPICAL | 0 refills | 30.00000 days | Status: AC
Start: 2020-06-11 — End: ?

## 2020-06-11 MED ORDER — OXYCODONE 5 MG PO TAB
5 mg | ORAL_TABLET | ORAL | 0 refills | 6.00000 days | Status: AC | PRN
Start: 2020-06-11 — End: ?

## 2020-06-27 ENCOUNTER — Encounter: Admit: 2020-06-27 | Discharge: 2020-06-27 | Payer: MEDICARE

## 2020-06-27 DIAGNOSIS — G5600 Carpal tunnel syndrome, unspecified upper limb: Secondary | ICD-10-CM

## 2020-06-27 DIAGNOSIS — E119 Type 2 diabetes mellitus without complications: Secondary | ICD-10-CM

## 2020-06-27 DIAGNOSIS — E785 Hyperlipidemia, unspecified: Secondary | ICD-10-CM

## 2020-06-27 DIAGNOSIS — M1990 Unspecified osteoarthritis, unspecified site: Secondary | ICD-10-CM

## 2020-06-27 DIAGNOSIS — M069 Rheumatoid arthritis, unspecified: Secondary | ICD-10-CM

## 2020-06-27 DIAGNOSIS — I280 Arteriovenous fistula of pulmonary vessels: Secondary | ICD-10-CM

## 2020-06-27 DIAGNOSIS — M810 Age-related osteoporosis without current pathological fracture: Secondary | ICD-10-CM

## 2020-06-27 DIAGNOSIS — K219 Gastro-esophageal reflux disease without esophagitis: Secondary | ICD-10-CM

## 2020-06-27 DIAGNOSIS — D899 Disorder involving the immune mechanism, unspecified: Secondary | ICD-10-CM

## 2020-06-27 DIAGNOSIS — IMO0002 Ulcer: Secondary | ICD-10-CM

## 2020-07-01 ENCOUNTER — Encounter: Admit: 2020-07-01 | Discharge: 2020-07-01 | Payer: MEDICARE

## 2020-07-01 ENCOUNTER — Ambulatory Visit: Admit: 2020-07-01 | Discharge: 2020-07-02 | Payer: MEDICARE

## 2020-07-01 DIAGNOSIS — IMO0002 Ulcer: Secondary | ICD-10-CM

## 2020-07-01 DIAGNOSIS — E785 Hyperlipidemia, unspecified: Secondary | ICD-10-CM

## 2020-07-01 DIAGNOSIS — E119 Type 2 diabetes mellitus without complications: Secondary | ICD-10-CM

## 2020-07-01 DIAGNOSIS — K219 Gastro-esophageal reflux disease without esophagitis: Secondary | ICD-10-CM

## 2020-07-01 DIAGNOSIS — M810 Age-related osteoporosis without current pathological fracture: Secondary | ICD-10-CM

## 2020-07-01 DIAGNOSIS — G5600 Carpal tunnel syndrome, unspecified upper limb: Secondary | ICD-10-CM

## 2020-07-01 DIAGNOSIS — M069 Rheumatoid arthritis, unspecified: Secondary | ICD-10-CM

## 2020-07-01 DIAGNOSIS — D899 Disorder involving the immune mechanism, unspecified: Secondary | ICD-10-CM

## 2020-07-01 DIAGNOSIS — I280 Arteriovenous fistula of pulmonary vessels: Secondary | ICD-10-CM

## 2020-07-01 DIAGNOSIS — L0291 Cutaneous abscess, unspecified: Secondary | ICD-10-CM

## 2020-07-01 DIAGNOSIS — M1990 Unspecified osteoarthritis, unspecified site: Secondary | ICD-10-CM

## 2020-07-01 NOTE — Progress Notes
Date of Service: 07/01/2020    Subjective:             Tykeyah Lattner is a 74 y.o. female presenting as a f/u for an isciorectal abscess I&D    History of Present Illness  Kehinde Obara is presenting for f/u of isciorectal abscess I&D on 5/25. She has not had pain at the incision site. No fevers, chills, abdominal pain, n/v, diarrhea, constipation, or blood with stool.           Review of Systems   Constitutional: Negative for chills and fever.   Respiratory: Negative for cough and shortness of breath.    Cardiovascular: Negative for chest pain.   Gastrointestinal: Negative for abdominal pain, blood in stool, constipation, diarrhea, nausea and vomiting.   Genitourinary: Negative for dysuria and hematuria.         Objective:         ? acetaminophen (TYLENOL) 500 mg tablet Take two tablets by mouth every 8 hours as needed. Max of 4,000 mg of acetaminophen in 24 hours.   ? allopurinol (ZYLOPRIM) 100 mg tablet Take 1 Tab by mouth daily. (Patient taking differently: Take 100 mg by mouth at bedtime daily.)   ? apixaban (ELIQUIS) 5 mg tablet Take 5 mg by mouth twice daily.   ? calcium carbonate (OS-CAL) 1250 mg tablet Take 1,250 mg by mouth twice daily.   ? cephalexin (KEFLEX) 500 mg capsule Take 4 capsules by mouth one hour before dental procedure. Use remaining capsules for future dental procedures.   ? cetirizine (ZYRTEC) 10 mg tablet Take 1 Tab by mouth daily as needed for Allergy symptoms.   ? cholecalciferol (VITAMIN D-3) 1,000 units tablet Take 2,000 Units by mouth daily.   ? esomeprazole DR (NEXIUM) 20 mg capsule Take 20 mg by mouth every morning.   ? fish oil /omega-3 fatty acids (SEA-OMEGA) 340/1000 mg capsule Take 2 Caps by mouth daily.   ? fluticasone (FLONASE) 50 mcg/actuation nasal spray Apply 2 Sprays to each nostril as directed daily as needed.   ? folic acid (FOLVITE) 1 mg tablet Take 1 Tab by mouth daily.   ? gabapentin (NEURONTIN) 400 mg capsule Take 400 mg by mouth twice daily.   ? metFORMIN (GLUCOPHAGE) 500 mg tablet Take 1 Tab by mouth twice daily with meals.   ? metoprolol XL (TOPROL XL) 100 mg extended release tablet Take one tablet by mouth daily.   ? nystatin (NYSTOP) 100,000 unit/g topical powder Below breasts   ? oxyCODONE (ROXICODONE) 5 mg tablet Take one tablet by mouth every 6 hours as needed   ? pantoprazole DR (PROTONIX) 40 mg tablet Take one tablet by mouth daily.   ? potassium chloride SR (K-DUR) 20 mEq tablet TAKE 1 TABLET BY MOUTH TWICE DAILY WITH MEALS AND A FULL GLASS OF WATER   ? senna (SENOKOT) 8.6 mg tablet Take 3 tablets by mouth at bedtime daily.   ? simvastatin (ZOCOR) 80 mg tablet Take 1 Tab by mouth at bedtime daily.   ? torsemide (DEMADEX) 20 mg tablet Take one tablet by mouth daily.   ? vitamins, multiple tablet Take 1 Tab by mouth daily.     Vitals:    07/01/20 0909   BP: 105/54   BP Source: Arm, Right Lower   Patient Position: Sitting   Pulse: 95   Weight: 93.9 kg (207 lb)   Height: 152.4 cm (60)   PainSc: Zero     Body mass index is 40.43 kg/m?Marland Kitchen  Physical Exam  Constitutional:       General: She is not in acute distress.     Appearance: She is not ill-appearing or toxic-appearing.   HENT:      Head: Normocephalic and atraumatic.      Right Ear: External ear normal.      Left Ear: External ear normal.   Eyes:      Conjunctiva/sclera: Conjunctivae normal.   Cardiovascular:      Rate and Rhythm: Normal rate.   Pulmonary:      Effort: Pulmonary effort is normal.   Abdominal:      General: Abdomen is flat. There is no distension.      Palpations: Abdomen is soft.      Tenderness: There is no abdominal tenderness. There is no guarding.   Musculoskeletal:      Cervical back: Normal range of motion and neck supple.   Skin:     General: Skin is warm and dry.      Comments: Incision with penrose present. Nonfluctuant, no drainage, no surrounding erythema or induration. Healthy granulation tissue present in wound base.    Neurological:      General: No focal deficit present.   Psychiatric: Mood and Affect: Mood normal.         Behavior: Behavior normal.              Assessment and Plan:  Marlenne Ridge is a 74yo female with a PMH of HFpEF, lymphedema, OSA, Afib, HTN, DM, GERD?&?s/p sigmoid colectomy 2/2 bowel obstruction with ostomy formation 2012 presenting for f/u of I&D of isciorectal abscess on 5/21.     - Penrose removed today  - Wound covered with gauze, dressing to be changed with gauze daily  - No packing needed  - Patient to send picture of wound via MyChart to [place] in 2 weeks to re-evaluate wound healing      ATTESTATION    I personally observed the resident performing the E/M, discussed case with resident, and concur with resident documentation of history, physical assessment and treatment plan unless otherwise noted.    Overall will call in 2 weeks if there is still drainage and she needs a f/u appointment.  Otherwise will f/u prn.      Staff name:  Windy Fast, MD Date:  07/01/2020

## 2020-07-02 DIAGNOSIS — K6139 Other ischiorectal abscess: Secondary | ICD-10-CM

## 2020-07-15 ENCOUNTER — Encounter: Admit: 2020-07-15 | Discharge: 2020-07-15 | Payer: MEDICARE

## 2020-07-15 DIAGNOSIS — M1990 Unspecified osteoarthritis, unspecified site: Secondary | ICD-10-CM

## 2020-07-15 DIAGNOSIS — I280 Arteriovenous fistula of pulmonary vessels: Secondary | ICD-10-CM

## 2020-07-15 DIAGNOSIS — G5600 Carpal tunnel syndrome, unspecified upper limb: Secondary | ICD-10-CM

## 2020-07-15 DIAGNOSIS — K219 Gastro-esophageal reflux disease without esophagitis: Secondary | ICD-10-CM

## 2020-07-15 DIAGNOSIS — M069 Rheumatoid arthritis, unspecified: Secondary | ICD-10-CM

## 2020-07-15 DIAGNOSIS — D899 Disorder involving the immune mechanism, unspecified: Secondary | ICD-10-CM

## 2020-07-15 DIAGNOSIS — E119 Type 2 diabetes mellitus without complications: Secondary | ICD-10-CM

## 2020-07-15 DIAGNOSIS — IMO0002 Ulcer: Secondary | ICD-10-CM

## 2020-07-15 DIAGNOSIS — M810 Age-related osteoporosis without current pathological fracture: Secondary | ICD-10-CM

## 2020-07-15 DIAGNOSIS — E785 Hyperlipidemia, unspecified: Secondary | ICD-10-CM

## 2020-07-23 ENCOUNTER — Encounter: Admit: 2020-07-23 | Discharge: 2020-07-23 | Payer: MEDICARE

## 2020-07-23 NOTE — Telephone Encounter
Enrolled in [employer] remote monitoring at time of the implant on 06/06/20. [employer] Get [employer]cted team has reached out to the patient multiple times without a response. Patient has been prescribed a remote transmitter by the Get Connected Team. Transmitter was shipped on 06/09/20. We have not received the patients initial transmission as of today. Patient will need to setup her remote transmitter and send an initial transmission. Message left on machine. MyChart message sent. DB.

## 2020-08-29 ENCOUNTER — Encounter: Admit: 2020-08-29 | Discharge: 2020-08-29 | Payer: MEDICARE

## 2020-09-03 ENCOUNTER — Inpatient Hospital Stay: Admit: 2020-09-03 | Discharge: 2020-09-03 | Payer: MEDICARE

## 2020-09-03 ENCOUNTER — Inpatient Hospital Stay: Admit: 2020-09-03 | Payer: MEDICARE

## 2020-09-03 ENCOUNTER — Encounter: Admit: 2020-09-03 | Discharge: 2020-09-03 | Payer: MEDICARE

## 2020-09-03 ENCOUNTER — Emergency Department: Admit: 2020-09-03 | Discharge: 2020-09-03 | Payer: MEDICARE

## 2020-09-03 DIAGNOSIS — E785 Hyperlipidemia, unspecified: Secondary | ICD-10-CM

## 2020-09-03 DIAGNOSIS — G5600 Carpal tunnel syndrome, unspecified upper limb: Secondary | ICD-10-CM

## 2020-09-03 DIAGNOSIS — M069 Rheumatoid arthritis, unspecified: Secondary | ICD-10-CM

## 2020-09-03 DIAGNOSIS — I4891 Unspecified atrial fibrillation: Principal | ICD-10-CM

## 2020-09-03 DIAGNOSIS — I280 Arteriovenous fistula of pulmonary vessels: Secondary | ICD-10-CM

## 2020-09-03 DIAGNOSIS — K219 Gastro-esophageal reflux disease without esophagitis: Secondary | ICD-10-CM

## 2020-09-03 DIAGNOSIS — I509 Heart failure, unspecified: Secondary | ICD-10-CM

## 2020-09-03 DIAGNOSIS — M810 Age-related osteoporosis without current pathological fracture: Secondary | ICD-10-CM

## 2020-09-03 DIAGNOSIS — M1990 Unspecified osteoarthritis, unspecified site: Secondary | ICD-10-CM

## 2020-09-03 DIAGNOSIS — D899 Disorder involving the immune mechanism, unspecified: Secondary | ICD-10-CM

## 2020-09-03 DIAGNOSIS — E119 Type 2 diabetes mellitus without complications: Secondary | ICD-10-CM

## 2020-09-03 DIAGNOSIS — J9601 Acute respiratory failure with hypoxia: Secondary | ICD-10-CM

## 2020-09-03 DIAGNOSIS — IMO0002 Ulcer: Secondary | ICD-10-CM

## 2020-09-03 LAB — AMMONIA: Lab: 22 umol/L (ref 9–35)

## 2020-09-03 LAB — PTT (APTT): Lab: 27 s (ref 24.0–36.5)

## 2020-09-03 LAB — BASIC METABOLIC PANEL
Lab: 1.1 mg/dL — ABNORMAL HIGH (ref 0.4–1.00)
Lab: 13 mg/dL (ref 7–25)
Lab: 136 MMOL/L — ABNORMAL LOW (ref 137–147)
Lab: 152 mg/dL — ABNORMAL HIGH (ref 70–100)
Lab: 45 mL/min — ABNORMAL LOW (ref >60)
Lab: 9 mg/dL (ref 8.5–10.6)

## 2020-09-03 LAB — URINALYSIS MICROSCOPIC REFLEX TO CULTURE

## 2020-09-03 LAB — URINALYSIS DIPSTICK REFLEX TO CULTURE
Lab: 5 g/dL — ABNORMAL LOW (ref 5.0–8.0)
Lab: NEGATIVE 10*3/uL (ref 0–0.20)
Lab: NEGATIVE K/UL — ABNORMAL HIGH (ref 3–12)
Lab: NEGATIVE U/L (ref 7–56)
Lab: NEGATIVE mL/min (ref 0–0.80)

## 2020-09-03 LAB — POC BLOOD GAS VEN
Lab: 2 MMOL/L
Lab: 23 MMOL/L
Lab: 42 mmHg (ref 36–50)
Lab: 61 mmHg — ABNORMAL HIGH (ref 33–48)
Lab: 7.3 (ref 7.30–7.40)
Lab: 12 MMOL/L
Lab: 15 MMOL/L
Lab: 23 mmHg — ABNORMAL LOW (ref 33–48)
Lab: 33 % — ABNORMAL LOW (ref 55–71)
Lab: 34 mmHg — ABNORMAL LOW (ref 36–50)
Lab: 7.2 — ABNORMAL LOW (ref 7.30–7.40)

## 2020-09-03 LAB — PHOSPHORUS: Lab: 3.4 mg/dL (ref 2.0–4.5)

## 2020-09-03 LAB — POC GLUCOSE: Lab: 97 mg/dL (ref 70–100)

## 2020-09-03 LAB — COVID-19 (SARS-COV-2) PCR

## 2020-09-03 LAB — C REACTIVE PROTEIN (CRP): Lab: 3.6 mg/dL — ABNORMAL HIGH (ref <1.0)

## 2020-09-03 LAB — POC TROPONIN: Lab: 0 ng/mL — ABNORMAL HIGH (ref 0.00–0.05)

## 2020-09-03 LAB — CBC AND DIFF
Lab: 12 10*3/uL — ABNORMAL HIGH (ref 4.5–11.0)
Lab: 25 — ABNORMAL HIGH (ref <20.7)

## 2020-09-03 LAB — MAGNESIUM: Lab: 1.4 mg/dL — ABNORMAL LOW (ref 1.6–2.6)

## 2020-09-03 LAB — PROCALCITONIN: Lab: 1.7 ng/mL

## 2020-09-03 LAB — COMPREHENSIVE METABOLIC PANEL: Lab: 139 MMOL/L (ref 137–147)

## 2020-09-03 LAB — TSH WITH FREE T4 REFLEX: Lab: 1.2 uU/mL — ABNORMAL HIGH (ref 0.35–5.00)

## 2020-09-03 LAB — SED RATE: Lab: 50 mm/h — ABNORMAL HIGH (ref 0–30)

## 2020-09-03 LAB — PROTIME INR (PT): Lab: 1.3 MMOL/L — ABNORMAL HIGH (ref 0.8–1.2)

## 2020-09-03 LAB — BNP POC ER: Lab: 822 pg/mL — ABNORMAL HIGH (ref 0–100)

## 2020-09-03 MED ORDER — MAGNESIUM SULFATE IN D5W 1 GRAM/100 ML IV PGBK
1 g | INTRAVENOUS | 0 refills | Status: CP
Start: 2020-09-03 — End: ?
  Administered 2020-09-03: 17:00:00 1 g via INTRAVENOUS

## 2020-09-03 MED ORDER — ACETAMINOPHEN 500 MG PO TAB
1000 mg | ORAL | 0 refills | Status: AC | PRN
Start: 2020-09-03 — End: ?
  Administered 2020-09-06 – 2020-09-12 (×5): 1000 mg via ORAL

## 2020-09-03 MED ORDER — OXYCODONE 5 MG PO TAB
5 mg | ORAL | 0 refills | Status: AC | PRN
Start: 2020-09-03 — End: ?

## 2020-09-03 MED ORDER — SIMVASTATIN 40 MG PO TAB
80 mg | Freq: Every evening | ORAL | 0 refills | Status: DC
Start: 2020-09-03 — End: 2020-09-03

## 2020-09-03 MED ORDER — ALLOPURINOL 100 MG PO TAB
100 mg | Freq: Every day | ORAL | 0 refills | Status: AC
Start: 2020-09-03 — End: ?
  Administered 2020-09-05 – 2020-09-12 (×8): 100 mg via ORAL

## 2020-09-03 MED ORDER — CEFTRIAXONE INJ 1GM IVP
1 g | INTRAVENOUS | 0 refills | Status: AC
Start: 2020-09-03 — End: ?
  Administered 2020-09-03 – 2020-09-07 (×5): 1 g via INTRAVENOUS

## 2020-09-03 MED ORDER — ROSUVASTATIN 20 MG PO TAB
20 mg | Freq: Every day | ORAL | 0 refills | Status: AC
Start: 2020-09-03 — End: ?
  Administered 2020-09-05 – 2020-09-07 (×3): 20 mg via ORAL

## 2020-09-03 MED ORDER — IMS MIXTURE TEMPLATE
400 mg | Freq: Two times a day (BID) | ORAL | 0 refills | Status: AC
Start: 2020-09-03 — End: ?

## 2020-09-03 MED ORDER — METOPROLOL SUCCINATE 50 MG PO TB24
50 mg | Freq: Once | ORAL | 0 refills | Status: AC
Start: 2020-09-03 — End: ?

## 2020-09-03 MED ORDER — DILTIAZEM 125MG/125ML NS IV DRIP
5-15 mg/h | INTRAVENOUS | 0 refills | Status: DC
Start: 2020-09-03 — End: 2020-09-03
  Administered 2020-09-03 (×2): 5 mg/h via INTRAVENOUS

## 2020-09-03 MED ORDER — PANTOPRAZOLE 40 MG PO TBEC
40 mg | Freq: Every day | ORAL | 0 refills | Status: AC
Start: 2020-09-03 — End: ?
  Administered 2020-09-05 – 2020-09-12 (×7): 40 mg via ORAL

## 2020-09-03 MED ORDER — FLUTICASONE PROPIONATE 50 MCG/ACTUATION NA SPSN
2 | Freq: Every day | NASAL | 0 refills | Status: AC
Start: 2020-09-03 — End: ?
  Administered 2020-09-04: 15:00:00 2 via NASAL

## 2020-09-03 MED ORDER — POTASSIUM CHLORIDE 20 MEQ PO TBTQ
40 meq | Freq: Once | ORAL | 0 refills | Status: CP
Start: 2020-09-03 — End: ?
  Administered 2020-09-03: 14:00:00 40 meq via ORAL

## 2020-09-03 MED ORDER — DIGOXIN 250 MCG/ML (0.25 MG/ML) IJ SOLN
250 ug | INTRAVENOUS | 0 refills | Status: CP
Start: 2020-09-03 — End: ?
  Administered 2020-09-03 (×2): 250 ug via INTRAVENOUS

## 2020-09-03 MED ORDER — MAGNESIUM SULFATE IN D5W 1 GRAM/100 ML IV PGBK
1 g | INTRAVENOUS | 0 refills | Status: CP
Start: 2020-09-03 — End: ?
  Administered 2020-09-03: 14:00:00 1 g via INTRAVENOUS

## 2020-09-03 MED ORDER — FUROSEMIDE 10 MG/ML IJ SOLN
40 mg | Freq: Every day | INTRAVENOUS | 0 refills | Status: DC
Start: 2020-09-03 — End: 2020-09-03

## 2020-09-03 MED ORDER — METOPROLOL TARTRATE 5 MG/5 ML IV SOLN
5 mg | Freq: Once | INTRAVENOUS | 0 refills | Status: CP
Start: 2020-09-03 — End: ?
  Administered 2020-09-03: 20:00:00 5 mg via INTRAVENOUS

## 2020-09-03 MED ORDER — METOPROLOL SUCCINATE 100 MG PO TB24
100 mg | Freq: Every day | ORAL | 0 refills | Status: AC
Start: 2020-09-03 — End: ?
  Administered 2020-09-05 – 2020-09-12 (×8): 100 mg via ORAL

## 2020-09-03 MED ORDER — IOHEXOL 350 MG IODINE/ML IV SOLN
100 mL | Freq: Once | INTRAVENOUS | 0 refills | Status: CP
Start: 2020-09-03 — End: ?
  Administered 2020-09-03: 15:00:00 100 mL via INTRAVENOUS

## 2020-09-03 MED ORDER — DIGOXIN 250 MCG/ML (0.25 MG/ML) IJ SOLN
250 ug | Freq: Once | INTRAVENOUS | 0 refills | Status: CP
Start: 2020-09-03 — End: ?
  Administered 2020-09-03: 10:00:00 250 ug via INTRAVENOUS

## 2020-09-03 MED ORDER — FOLIC ACID 1 MG PO TAB
1 mg | Freq: Every day | ORAL | 0 refills | Status: AC
Start: 2020-09-03 — End: ?
  Administered 2020-09-05 – 2020-09-12 (×8): 1 mg via ORAL

## 2020-09-03 MED ORDER — APIXABAN 5 MG PO TAB
5 mg | Freq: Two times a day (BID) | ORAL | 0 refills | Status: AC
Start: 2020-09-03 — End: ?
  Administered 2020-09-05 – 2020-09-12 (×12): 5 mg via ORAL

## 2020-09-03 MED ORDER — MAGNESIUM SULFATE IN D5W 1 GRAM/100 ML IV PGBK
1 g | Freq: Once | INTRAVENOUS | 0 refills | Status: CP
Start: 2020-09-03 — End: ?
  Administered 2020-09-03: 10:00:00 1 g via INTRAVENOUS

## 2020-09-03 MED ORDER — DIGOXIN 250 MCG/ML (0.25 MG/ML) IJ SOLN
250 ug | INTRAVENOUS | 0 refills | Status: DC
Start: 2020-09-03 — End: 2020-09-03

## 2020-09-03 MED ORDER — MAGNESIUM SULFATE IN D5W 1 GRAM/100 ML IV PGBK
1 g | INTRAVENOUS | 0 refills | Status: CP
Start: 2020-09-03 — End: ?
  Administered 2020-09-03: 22:00:00 1 g via INTRAVENOUS

## 2020-09-03 MED ORDER — SODIUM CHLORIDE 0.9 % IJ SOLN
50 mL | Freq: Once | INTRAVENOUS | 0 refills | Status: CP
Start: 2020-09-03 — End: ?
  Administered 2020-09-03: 15:00:00 50 mL via INTRAVENOUS

## 2020-09-03 MED ORDER — FUROSEMIDE 10 MG/ML IJ SOLN
40 mg | Freq: Two times a day (BID) | INTRAVENOUS | 0 refills | Status: AC
Start: 2020-09-03 — End: ?
  Administered 2020-09-03 – 2020-09-04 (×3): 40 mg via INTRAVENOUS

## 2020-09-03 MED ORDER — NALOXONE 0.4 MG/ML IJ SOLN
.2 mg | Freq: Once | INTRAVENOUS | 0 refills | Status: CP
Start: 2020-09-03 — End: ?
  Administered 2020-09-03: 20:00:00 0.2 mg via INTRAVENOUS

## 2020-09-03 NOTE — ED Notes
Pt's daughter Aurther Loft called and updated on pt's plan of care.

## 2020-09-03 NOTE — ED Notes
Pt lethargic at this time. Difficulty staying awake during conversation. Will answer questions and fall asleep. Will hold PO meds at this time as she is unable to stay awake.

## 2020-09-03 NOTE — ED Notes
Pt continues to be very lethargic. Pt having difficulty staying awake for cardiology. Pt requiring painful stimuli. VSS.

## 2020-09-03 NOTE — ED Notes
Pt's daughter Junius Roads called for update and updated on plan of care. She would liked to be called with any new updates at her home phone number listed on demographics.

## 2020-09-03 NOTE — ED Notes
Pt's daughter Junius Roads called and updated on plan of care. Pt's daughter Levon Hedger [phone].

## 2020-09-03 NOTE — Consults
Consultation Report    Kathey Simer  Admission Date:  09/03/2020                     Assessment/Plan:    Principal Problem:    Atrial fibrillation with RVR (HCC)  Active Problems:    Atrial fibrillation with rapid ventricular response (HCC)        In summary, Madison Johnston is a 74 year old diabetic lady, currently very somnolent with underlying cardiac and related issues:    1.  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  Currently the rates are well controlled with stable blood pressures.  She has received 1 dose of digoxin 0.25 mg IV at 5 AM today.  IV diltiazem was discontinued due to low blood pressure.  Toprol-XL has been resumed at 100 mg daily, has not gotten it so far.    -Continue apixaban and Toprol-XL at the same dose.  I am not sure why her presenting heart rates were so high.  Compliance may have been an issue.  We can see how the heart rates do during the hospitalization and then adjust the medications accordingly    2.  Diastolic heart failure, based on chest x-ray and elevated BNP, recent echo very limited but with normal LV systolic function ?unable to assess the patient's baseline functional capacity.    -Agree with IV furosemide at 40 mg twice daily.  Magnesium is being supplemented.    3.  Hypertension, controlled      __________________________________________________________________________________        Chief Complaint: Atrial fibrillation rapid rates    History of Present Illness: Madison Johnston is a 74 y.o. female , history of type 2 diabetes, obstructive sleep apnea, essential hypertension, dyslipidemia.      The atrial fibrillation appears to be permanent.  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    The history was taken predominantly from the chart.  It appears that she was brought into the emergency room with acute left leg pain.    Initial heart rates on the EKG of 133/min.  Chest x-ray showed some congestive changes.  Her medications prior to admission included apixaban, simvastatin, Toprol-XL 100 mg daily and torsemide at 20 mg daily.    Allergies?none known    Social history?non-smoker    Medical History:   Diagnosis Date   ? Arthritis    ? Carpal tunnel syndrome    ? DM (diabetes mellitus) (HCC)    ? Essential hypertension    ? GERD (gastroesophageal reflux disease)    ? Hyperlipidemia    ? Immune disorder (HCC)     RA   ? 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 Osteoarthritis    ? Osteoporosis    ? Rheumatoid arthritis (HCC)    ? 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 Ulcer      Surgical History:   Procedure Laterality Date   ? COLOSTOMY  2012    with sigmoidectomy   ? 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 SALPINGO-OOPHORECTOMY  August 2012    Done while in OR with sigmoidectomy from abscess adhesion   ? HUMERUS FRACTURE SURGERY Left 08/2013   ? LEFT PRIMARY TOTAL  KNEE ARTHROPLASTY Left 07/08/2015    Performed by Simonne Maffucci, MD at [facility] OR   ? ARTHROCENTESIS/ ASPIRATION/ INJECTION MAJOR JOINT/ BURSA Left 05/09/2020    Performed by Heddings, Revonda Standard, MD at [facility] OR   ? INCISION AND DRAINAGE ISCHIORECTAL/ PERIRECTAL ABSCESS  IRRIGATION AND DEBRIDEMENT N/A 05/13/2020    Performed by Guinevere Scarlet, MD at [facility] OR   ? TRANSCATHETER INSERTION/ REPLACEMENT RIGHT VENTRICULAR PERMANENT LEADLESS PACEMAKER WITH/ WITHOUT?IMAGE GUIDANCE/ DEVICE EVALUATION N/A 06/06/2020    Performed by Jen Mow, MD at [facility] CATH LAB   ? HX APPENDECTOMY  74 yrs old   ? HX CARPAL TUNNEL RELEASE Bilateral    ? HX KNEE SURGERY Right     total knee arthroplasty   ? HX SHOULDER SURGERY Right     9 pins, plate, and bone graft     Family History   Adopted: Yes     Social History     Socioeconomic History   ? Marital status: Widowed     Spouse name: Not on file   ? Number of children: Not on file   ? Years of education: Not on file   ? Highest education level: Not on file   Occupational History   ? Not on file   Tobacco Use   ? Smoking status: Never Smoker   ? Smokeless tobacco: Never Used   Substance and Sexual Activity   ? Alcohol use: No   ? Drug use: No   ? Sexual activity: Not on file   Other Topics Concern   ? Not on file   Social History Narrative   ? Not on file        Allergies:  Patient has no known allergies.    Medications:  Scheduled Meds:allopurinoL (ZYLOPRIM) tablet 100 mg, 100 mg, Oral, QDAY  apixaban (ELIQUIS) tablet 5 mg, 5 mg, Oral, BID  digoxin (LANOXIN) injection 250 mcg, 250 mcg, Intravenous, Q6H*  fluticasone propionate (FLONASE) nasal spray 2 spray, 2 spray, Each Nostril, QDAY  folic acid (FOLVITE) tablet 1 mg, 1 mg, Oral, QDAY  furosemide (LASIX) injection 40 mg, 40 mg, Intravenous, BID(9-17)  gabapentin (NEURONTIN) capsule 400 mg, 400 mg, Oral, BID  magnesium sulfate   1 g/D5W 100 mL IVPB, 1 g, Intravenous, Q4H*   Followed by  magnesium sulfate   1 g/D5W 100 mL IVPB, 1 g, Intravenous, Q4H*   Followed by  magnesium sulfate   1 g/D5W 100 mL IVPB, 1 g, Intravenous, Q4H*  metoprolol XL (TOPROL XL) tablet 100 mg, 100 mg, Oral, QDAY  pantoprazole DR (PROTONIX) tablet 40 mg, 40 mg, Oral, QDAY  simvastatin (ZOCOR) tablet 80 mg, 80 mg, Oral, QHS    Continuous Infusions:  PRN and Respiratory Meds:acetaminophen Q8H PRN, oxyCODONE Q6H PRN       Review of Systems:  Could not be obtained due to patient's somnolescence    Physical Exam:  Vital Signs: Last Filed In 24 Hours Vital Signs: 24 Hour Range   BP: 125/68 (09/15 0930)  Temp: 36.7 ?C (98 ?F) (09/15 0234)  Pulse: 85 (09/15 1000)  Respirations: 17 PER MINUTE (09/15 1000)  SpO2: 96 % (09/15 0700)  SpO2 Pulse: 100 (09/15 0700)  Height: 152.4 cm (5') (09/15 0234) BP: (85-144)/(56-115)   Temp:  [36.7 ?C (98 ?F)]   Pulse:  [85-130]   Respirations:  [12 PER MINUTE-23 PER MINUTE]   SpO2:  [94 %-98 %]    Intensity Pain Scale (Self Report): 4 (09/03/20 0236)  Intake/Output Summary (Last 24 hours) at 09/03/2020 1002  Last data filed at 09/03/2020 0607  Gross per 24 hour   Intake 100.58 ml   Output 500 ml   Net -399.42 ml        General Appearance: No acute distress, on 2 L of nasal cannula  Skin: warm, no ulcers, no xanthomas  Eyes/lids: no conjunctival pallor, no arcus, no xanthelasma  Lips/oral mucosa: no cyanosis  Neck: neck veins difficult to assess but do not appear to be distended   Carotids: normal upstroke, no bruit  Chest: normal appearance  Lungs: clear anteriorly  Cardiac rhythm: irregular rhythm & controlled rate  Cardiac auscultation: Normal S1 & S2, no S3 or S4, 2/6 esm base and lsb  Abdomen: soft, non tender, bowel sounds normal, no pulsations/bruits  Extremities: no LE edema, no varicosities, 2+ distal pulses  Neurologic/psych: Very somnolent, responds to painful stimuli  Lab/Radiology/Other Diagnostic Tests:  CBC w/Diff    Lab Results   Component Value Date/Time    WBC 12.9 (H) 09/03/2020 03:48 AM    RBC 4.25 09/03/2020 03:48 AM    HGB 11.8 (L) 09/03/2020 03:48 AM    HCT 36.8 09/03/2020 03:48 AM    MCV 86.6 09/03/2020 03:48 AM    MCH 27.8 09/03/2020 03:48 AM    MCHC 32.1 09/03/2020 03:48 AM    RDW 18.3 (H) 09/03/2020 03:48 AM    PLTCT 253 09/03/2020 03:48 AM    MPV 8.4 09/03/2020 03:48 AM    Lab Results   Component Value Date/Time    NEUT 93 (H) 09/03/2020 03:48 AM    ANC 12.07 (H) 09/03/2020 03:48 AM    LYMA 1 (L) 09/03/2020 03:48 AM    ALC 0.14 (L) 09/03/2020 03:48 AM    MONA 3 (L) 09/03/2020 03:48 AM    AMC 0.35 09/03/2020 03:48 AM    EOSA 2 09/03/2020 03:48 AM    AEC 0.24 09/03/2020 03:48 AM    BASA 1 09/03/2020 03:48 AM    ABC 0.10 09/03/2020 03:48 AM       Comprehensive Metabolic Profile    Lab Results   Component Value Date/Time    NA 139 09/03/2020 03:48 AM    K 3.9 09/03/2020 03:48 AM    CL 106 09/03/2020 03:48 AM    CO2 21 09/03/2020 03:48 AM    GAP 12 09/03/2020 03:48 AM    BUN 14 09/03/2020 03:48 AM    CR 0.92 09/03/2020 03:48 AM    GLU 130 (H) 09/03/2020 03:48 AM    Lab Results   Component Value Date/Time    CA 8.8 09/03/2020 03:48 AM    PO4 3.4 09/03/2020 03:48 AM    ALBUMIN 3.4 (L) 09/03/2020 03:48 AM    TOTPROT 6.9 09/03/2020 03:48 AM    ALKPHOS 78 09/03/2020 03:48 AM    AST 32 09/03/2020 03:48 AM    ALT 11 09/03/2020 03:48 AM    TOTBILI 0.5 09/03/2020 03:48 AM    GFR 60 (L) 09/03/2020 03:48 AM    GFRAA >60 09/03/2020 03:48 AM       Thyroid Studies    Lab Results   Component Value Date/Time    TSH 1.20 09/03/2020 03:48 AM    Lab Results   Component Value Date/Time    TNI 0.02 05/22/2020 08:45 AM    TNI 0.03 05/07/2020 03:45 PM    TNI 0.04 05/07/2020 08:43 AM    CKMB 3.6 (H) 06/13/2017 12:00 AM    MYOGLB 100.7  06/13/2017 12:00 AM           Negative troponin  ECG: EKG shows atrial fibrillation, left anterior fascicular block, small QRS complexes, fast rates  Echocardiogram: Echo done in May of this year has shown probable normal LV systolic function, sclerotic aortic valve, mild to moderate TR  Chest X-Ray: Mild congestive changes   Telemetry: Atrial fibrillation, rates are currently controlled, no red alarms    Jesse Sans, MD

## 2020-09-04 ENCOUNTER — Encounter: Admit: 2020-09-04 | Discharge: 2020-09-04 | Payer: MEDICARE

## 2020-09-04 DIAGNOSIS — G5600 Carpal tunnel syndrome, unspecified upper limb: Secondary | ICD-10-CM

## 2020-09-04 DIAGNOSIS — D899 Disorder involving the immune mechanism, unspecified: Secondary | ICD-10-CM

## 2020-09-04 DIAGNOSIS — E785 Hyperlipidemia, unspecified: Secondary | ICD-10-CM

## 2020-09-04 DIAGNOSIS — I280 Arteriovenous fistula of pulmonary vessels: Secondary | ICD-10-CM

## 2020-09-04 DIAGNOSIS — M069 Rheumatoid arthritis, unspecified: Secondary | ICD-10-CM

## 2020-09-04 DIAGNOSIS — IMO0002 Ulcer: Secondary | ICD-10-CM

## 2020-09-04 DIAGNOSIS — K219 Gastro-esophageal reflux disease without esophagitis: Secondary | ICD-10-CM

## 2020-09-04 DIAGNOSIS — M1990 Unspecified osteoarthritis, unspecified site: Secondary | ICD-10-CM

## 2020-09-04 DIAGNOSIS — M810 Age-related osteoporosis without current pathological fracture: Secondary | ICD-10-CM

## 2020-09-04 DIAGNOSIS — E119 Type 2 diabetes mellitus without complications: Secondary | ICD-10-CM

## 2020-09-05 MED ADMIN — POTASSIUM CHLORIDE IN WATER 10 MEQ/50 ML IV PGBK [11075]: 10 meq | INTRAVENOUS | @ 01:00:00 | Stop: 2020-09-05 | NDC 00338070541

## 2020-09-05 MED ADMIN — POTASSIUM CHLORIDE IN WATER 10 MEQ/50 ML IV PGBK [11075]: 10 meq | INTRAVENOUS | @ 08:00:00 | Stop: 2020-09-05 | NDC 00338070541

## 2020-09-05 MED ADMIN — POTASSIUM CHLORIDE IN WATER 10 MEQ/50 ML IV PGBK [11075]: 10 meq | INTRAVENOUS | @ 06:00:00 | Stop: 2020-09-05 | NDC 00338070541

## 2020-09-05 MED ADMIN — POTASSIUM CHLORIDE IN WATER 10 MEQ/50 ML IV PGBK [11075]: 10 meq | INTRAVENOUS | @ 03:00:00 | Stop: 2020-09-05 | NDC 00338070541

## 2020-09-05 MED ADMIN — SODIUM CHLORIDE 0.9 % IV SOLP [27838]: 250 mL | INTRAVENOUS | @ 01:00:00 | Stop: 2020-09-05 | NDC 00338004902

## 2020-09-06 ENCOUNTER — Inpatient Hospital Stay: Admit: 2020-09-06 | Discharge: 2020-09-06 | Payer: MEDICARE

## 2020-09-06 MED ADMIN — POTASSIUM CHLORIDE 20 MEQ PO TBTQ [35943]: 40 meq | ORAL | @ 03:00:00 | Stop: 2020-09-06 | NDC 00245531989

## 2020-09-06 MED ADMIN — MAGNESIUM SULFATE IN D5W 1 GRAM/100 ML IV PGBK [166578]: 1 g | INTRAVENOUS | Stop: 2020-09-06 | NDC 00409672723

## 2020-09-07 ENCOUNTER — Encounter: Admit: 2020-09-07 | Discharge: 2020-09-07 | Payer: MEDICARE

## 2020-09-07 MED ADMIN — POLYETHYLENE GLYCOL 3350 17 GRAM PO PWPK [25424]: 17 g | ORAL | @ 19:00:00 | NDC 00904693186

## 2020-09-07 MED ADMIN — SENNOSIDES-DOCUSATE SODIUM 8.6-50 MG PO TAB [40926]: 1 | ORAL | @ 19:00:00 | NDC 67618011060

## 2020-09-07 MED ADMIN — FUROSEMIDE 40 MG PO TAB [3295]: 40 mg | ORAL | @ 15:00:00 | NDC 51079007301

## 2020-09-08 MED ADMIN — POTASSIUM CHLORIDE 20 MEQ PO TBTQ [35943]: 40 meq | ORAL | @ 14:00:00 | Stop: 2020-09-08 | NDC 00245531989

## 2020-09-08 MED ADMIN — SENNOSIDES-DOCUSATE SODIUM 8.6-50 MG PO TAB [40926]: 1 | ORAL | @ 14:00:00 | NDC 67618011060

## 2020-09-08 MED ADMIN — POLYETHYLENE GLYCOL 3350 17 GRAM PO PWPK [25424]: 17 g | ORAL | @ 15:00:00 | NDC 00904693186

## 2020-09-08 MED ADMIN — SENNOSIDES-DOCUSATE SODIUM 8.6-50 MG PO TAB [40926]: 1 | ORAL | @ 01:00:00 | NDC 67618011060

## 2020-09-08 MED ADMIN — MAGNESIUM OXIDE 400 MG (241.3 MG MAGNESIUM) PO TAB [10491]: 400 mg | ORAL | @ 22:00:00 | Stop: 2020-09-08 | NDC 10006070028

## 2020-09-08 MED ADMIN — POTASSIUM CHLORIDE 20 MEQ PO TBTQ [35943]: 40 meq | ORAL | @ 22:00:00 | Stop: 2020-09-08 | NDC 00245531989

## 2020-09-09 MED ADMIN — POLYETHYLENE GLYCOL 3350 17 GRAM PO PWPK [25424]: 17 g | ORAL | @ 15:00:00 | NDC 00904693186

## 2020-09-09 MED ADMIN — SENNOSIDES-DOCUSATE SODIUM 8.6-50 MG PO TAB [40926]: 1 | ORAL | @ 03:00:00 | NDC 70000052601

## 2020-09-09 MED ADMIN — GABAPENTIN 400 MG PO CAP [18307]: 400 mg | ORAL | @ 15:00:00 | NDC 00904666761

## 2020-09-09 MED ADMIN — GABAPENTIN 400 MG PO CAP [18307]: 400 mg | ORAL | @ 03:00:00 | NDC 00904666761

## 2020-09-09 MED ADMIN — MAGNESIUM OXIDE 400 MG (241.3 MG MAGNESIUM) PO TAB [10491]: 400 mg | ORAL | @ 19:00:00 | NDC 10006070028

## 2020-09-09 MED ADMIN — SENNOSIDES-DOCUSATE SODIUM 8.6-50 MG PO TAB [40926]: 1 | ORAL | @ 15:00:00 | NDC 67618011060

## 2020-09-10 MED ADMIN — COVID-19 VACC,MRNA(PFIZER)(PF) 30 MCG/0.3 ML IM SUSR [454788]: 30 ug | INTRAMUSCULAR | @ 17:00:00 | Stop: 2020-09-10 | NDC 59267100001

## 2020-09-10 MED ADMIN — MAGNESIUM OXIDE 400 MG (241.3 MG MAGNESIUM) PO TAB [10491]: 400 mg | ORAL | @ 14:00:00 | NDC 10006070028

## 2020-09-10 MED ADMIN — GABAPENTIN 400 MG PO CAP [18307]: 400 mg | ORAL | @ 02:00:00 | NDC 00904666761

## 2020-09-10 MED ADMIN — SENNOSIDES-DOCUSATE SODIUM 8.6-50 MG PO TAB [40926]: 1 | ORAL | @ 02:00:00 | NDC 67618011060

## 2020-09-10 MED ADMIN — GABAPENTIN 400 MG PO CAP [18307]: 400 mg | ORAL | @ 14:00:00 | NDC 00904666761

## 2020-09-10 MED ADMIN — FUROSEMIDE 20 MG PO TAB [3294]: 20 mg | ORAL | @ 14:00:00 | NDC 51079007201

## 2020-09-10 MED ADMIN — POTASSIUM CHLORIDE 20 MEQ PO TBTQ [35943]: 20 meq | ORAL | @ 14:00:00 | NDC 00245531989

## 2020-09-10 MED ADMIN — SENNOSIDES-DOCUSATE SODIUM 8.6-50 MG PO TAB [40926]: 1 | ORAL | @ 14:00:00 | NDC 67618011060

## 2020-09-11 MED ADMIN — MAGNESIUM OXIDE 400 MG (241.3 MG MAGNESIUM) PO TAB [10491]: 400 mg | ORAL | @ 14:00:00 | NDC 10006070028

## 2020-09-11 MED ADMIN — GABAPENTIN 400 MG PO CAP [18307]: 400 mg | ORAL | @ 03:00:00 | NDC 00904666761

## 2020-09-11 MED ADMIN — SENNOSIDES-DOCUSATE SODIUM 8.6-50 MG PO TAB [40926]: 1 | ORAL | @ 14:00:00 | NDC 70000052601

## 2020-09-11 MED ADMIN — POLYETHYLENE GLYCOL 3350 17 GRAM PO PWPK [25424]: 17 g | ORAL | @ 14:00:00 | NDC 00904693186

## 2020-09-11 MED ADMIN — POTASSIUM CHLORIDE 20 MEQ PO TBTQ [35943]: 20 meq | ORAL | @ 14:00:00 | NDC 00245531989

## 2020-09-11 MED ADMIN — GABAPENTIN 400 MG PO CAP [18307]: 400 mg | ORAL | @ 14:00:00 | NDC 00904666761

## 2020-09-11 MED ADMIN — FUROSEMIDE 20 MG PO TAB [3294]: 20 mg | ORAL | @ 14:00:00 | NDC 51079007201

## 2020-09-12 MED ADMIN — SENNOSIDES-DOCUSATE SODIUM 8.6-50 MG PO TAB [40926]: 1 | ORAL | @ 02:00:00 | NDC 70000052601

## 2020-09-12 MED ADMIN — MAGNESIUM OXIDE 400 MG (241.3 MG MAGNESIUM) PO TAB [10491]: 400 mg | ORAL | @ 14:00:00 | Stop: 2020-09-12 | NDC 10006070028

## 2020-09-12 MED ADMIN — GABAPENTIN 400 MG PO CAP [18307]: 400 mg | ORAL | @ 14:00:00 | Stop: 2020-09-12 | NDC 00904666761

## 2020-09-12 MED ADMIN — FUROSEMIDE 20 MG PO TAB [3294]: 20 mg | ORAL | @ 14:00:00 | Stop: 2020-09-12 | NDC 51079007201

## 2020-09-12 MED ADMIN — GABAPENTIN 400 MG PO CAP [18307]: 400 mg | ORAL | @ 02:00:00 | NDC 00904666761

## 2020-12-16 ENCOUNTER — Encounter: Admit: 2020-12-16 | Discharge: 2020-12-16 | Payer: MEDICARE

## 2020-12-16 NOTE — Telephone Encounter
Patient was scheduled for a [employer] on 12/10/20 that has not been received. Patient was instructed to send a manual transmission. Instructed if the transmitter does not appear to be working properly, they need to contact the device company directly. Patient was provided with that contact number. Requested the patient send [place] a MyChart message or contact our device nurses at [phone] to let us know after they have sent their transmission. Called preferred Phone number, Got no answer LVM, MyChart is set up MyChart sent. CDJ

## 2020-12-23 ENCOUNTER — Encounter: Admit: 2020-12-23 | Discharge: 2020-12-23 | Payer: MEDICARE

## 2021-01-13 ENCOUNTER — Encounter: Admit: 2021-01-13 | Discharge: 2021-01-13 | Payer: MEDICARE

## 2021-01-27 ENCOUNTER — Encounter: Admit: 2021-01-27 | Discharge: 2021-01-27 | Payer: MEDICARE

## 2021-01-27 DIAGNOSIS — Z950 Presence of cardiac pacemaker: Secondary | ICD-10-CM

## 2021-01-29 ENCOUNTER — Encounter

## 2021-01-29 DIAGNOSIS — Z950 Presence of cardiac pacemaker: Secondary | ICD-10-CM

## 2021-01-29 DIAGNOSIS — I4819 Other persistent atrial fibrillation: Secondary | ICD-10-CM

## 2021-01-29 DIAGNOSIS — M069 Rheumatoid arthritis, unspecified: Secondary | ICD-10-CM

## 2021-01-29 DIAGNOSIS — M810 Age-related osteoporosis without current pathological fracture: Secondary | ICD-10-CM

## 2021-01-29 DIAGNOSIS — IMO0002 Ulcer: Secondary | ICD-10-CM

## 2021-01-29 DIAGNOSIS — E119 Type 2 diabetes mellitus without complications: Secondary | ICD-10-CM

## 2021-01-29 DIAGNOSIS — K219 Gastro-esophageal reflux disease without esophagitis: Secondary | ICD-10-CM

## 2021-01-29 DIAGNOSIS — E785 Hyperlipidemia, unspecified: Secondary | ICD-10-CM

## 2021-01-29 DIAGNOSIS — I280 Arteriovenous fistula of pulmonary vessels: Secondary | ICD-10-CM

## 2021-01-29 DIAGNOSIS — D899 Disorder involving the immune mechanism, unspecified: Secondary | ICD-10-CM

## 2021-01-29 DIAGNOSIS — G5600 Carpal tunnel syndrome, unspecified upper limb: Secondary | ICD-10-CM

## 2021-01-29 DIAGNOSIS — M1990 Unspecified osteoarthritis, unspecified site: Secondary | ICD-10-CM

## 2021-01-29 DIAGNOSIS — I4891 Unspecified atrial fibrillation: Secondary | ICD-10-CM

## 2021-01-29 MED ORDER — FUROSEMIDE 40 MG PO TAB
40 mg | ORAL_TABLET | Freq: Every morning | ORAL | 3 refills | 90.00000 days | Status: AC
Start: 2021-01-29 — End: ?

## 2021-01-29 NOTE — Progress Notes
Date of Service: 01/29/2021    Madison Johnston is a 75 y.o. female.       HPI     Madison Johnston was in the [place] clinic today with her daughter.  Since I last saw her she was hospitalized at [facility] and underwent implantation of a leadless pacemaker for her atrial fibrillation and symptomatic pauses.  Since that time she has not had problems with syncope or near syncope.    Her compliance with medications continues to be a concern.  For some reason she simply stopped taking her metoprolol about a week ago and the ventricular rate today was fairly rapid.  Corresponding with the increase in heart rate she has developed more trouble with peripheral edema.  It is apparent that she sleeps in a chair and sits for most of the day with her feet on the floor.  She is very resistant to the idea of sleeping in a bed with her legs elevated because this position hurts her back.    She has not had any bleeding complications related to oral anticoagulation.  She has had no problems with TIA or stroke symptoms.  She denies any angina.       Vitals:    01/29/21 1526   BP: 132/78   BP Source: Arm, Left Upper   Patient Position: Sitting   Pulse: (!) 147   SpO2: 97%   Weight: 97.1 kg (214 lb)   Height: 152.4 cm (5')   PainSc: Zero     Body mass index is 41.79 kg/m?Marland Kitchen     Past Medical History  Patient Active Problem List    Diagnosis Date Noted   ? Gout flare 09/01/2011     Priority: High   ? Cardiac pacemaker 01/29/2021   ? Elevated LFTs 09/12/2020   ? Atrial fibrillation with RVR (HCC) 09/03/2020   ? Atrial fibrillation with rapid ventricular response (HCC) 09/03/2020   ? Decubitus ulcer of left lower back, stage 2 (HCC) 06/02/2020   ? GI bleed 05/14/2020   ? Type 2 diabetes mellitus with skin complication, with long-term current use of insulin (HCC) 05/07/2020   ? Sepsis (HCC) 05/07/2020   ? Persistent atrial fibrillation (HCC) 05/07/2020   ? HLD (hyperlipidemia) 05/07/2020   ? Lethargy 05/07/2020   ? Lymphedema 04/10/2020   ? Chronic heart failure with preserved ejection fraction (HFpEF) (HCC) 01/08/2020   ? Chronic anticoagulation 01/08/2020   ? Mixed dyslipidemia 01/08/2020   ? OSA (obstructive sleep apnea) 11/28/2019   ? Acute heart failure with preserved ejection fraction (HCC) 11/28/2019   ? 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 Screening for cardiovascular condition 01/14/2017   ? Paroxysmal A-fib [facility]) 11/07/2016     11/01/16 - AF presentation to [facility] ED.  Converted spontaneously to SR on IV diltiazem.  Eliquis started.  11/02/16 - Echo [facility]):  EF 65%.  Mildly thickened mitral leaflets.  Mild MAC.  Mean AV gradient 8 mmHg.  Otherwise normal study.     ? HTN (hypertension) 07/08/2015   ? DM (diabetes mellitus screen) 07/08/2015   ? 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 Pre-operative cardiovascular examination 07/19/2014   ? 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 Fracture of lateral condyle of femur (HCC) 08/22/2013   ? Personal history of colonic polyps 07/12/2012     Colonoscopy 03/2012: TI normal. Rectal stump  normal. Mild diverticulosis in the descending colon. Cecal polyp (TA) removed. Three sessile polyps in the descending colon removed (TAs) .      ? Diverticulosis of colon 09/21/2011   ? Diverticulitis of colon with perforation 09/21/2011     S/P Sigmoid colectomy and Hartman's in 07/2011.       ? Postoperative wound infection 09/21/2011   ? S/P colon resection 09/21/2011   ? Morbid obesity (HCC) 08/17/2011   ? Urinary retention 08/17/2011   ? Anxiety 08/17/2011   ? Hypotension 08/17/2011   ? Hypoalbuminemia 08/16/2011   ? Hypocalcemia 08/16/2011   ? Gait abnormality 08/13/2011   ? Debility 08/13/2011   ? Anemia 08/13/2011   ? Post-operative pain 08/13/2011   ? Sacral pressure ulcer 08/13/2011   ? Hypokalemia 08/13/2011   ? Hypoxia 07/27/2011   ? Constipation 07/14/2011     Change in bowel habits and new constipation since Feb 2012. Two admissions to [facility] in 02 and 03/12 for colitis found on CT ad and treated with ABx. Admitted to [facility] for large bowel obstruction in 07/2011, had surgery for sigmoid inflammatory mass     ? Rheumatoid arthritis 07/14/2011   ? GERD (gastroesophageal reflux disease) 07/14/2011     Currently controlled     ? Osteoporosis 07/14/2011         Review of Systems   Constitutional: Negative.   HENT: Negative.    Eyes: Negative.    Cardiovascular: Negative.    Respiratory: Negative.    Endocrine: Negative.    Hematologic/Lymphatic: Negative.    Skin: Negative.    Musculoskeletal: Negative.    Gastrointestinal: Negative.    Genitourinary: Negative.    Neurological: Negative.    Psychiatric/Behavioral: Negative.    Allergic/Immunologic: Negative.        Physical Exam    Physical Exam   General Appearance: no distress, morbidly obese  Skin: warm, no ulcers or xanthomas   Digits and Nails: no cyanosis or clubbing   Eyes: conjunctivae and lids normal, pupils are equal and round   Teeth/Gums/Palate: dentition unremarkable, no lesions   Lips & Oral Mucosa: no pallor or cyanosis   Neck Veins: normal JVP , neck veins are not distended   Thyroid: no nodules, masses, tenderness or enlargement   Chest Inspection: chest is normal in appearance   Respiratory Effort: breathing comfortably, no respiratory distress   Auscultation/Percussion: lungs clear to auscultation, no rales or rhonchi, no wheezing   PMI: PMI not enlarged or displaced   Cardiac Rhythm: irregular rhythm and fast rate   Cardiac Auscultation: S1, S2 normal, no rub, no gallop   Murmurs: no murmur   Peripheral Circulation: normal peripheral circulation   Carotid Arteries: normal carotid upstroke bilaterally, no bruits   Radial Arteries: normal symmetric radial pulses   Abdominal Aorta: no abdominal aortic bruit   Pedal Pulses: normal symmetric pedal pulses   Lower Extremity Edema: 2+ bilateral lower extremity edema Abdominal Exam: soft, non-tender, no masses, bowel sounds normal   Liver & Spleen: no organomegaly   Gait & Station: in a wheelchair  Muscle Strength: normal muscle tone   Orientation: oriented to time, place and person   Affect & Mood: appropriate and sustained affect   Language and Memory: patient responsive and seems to comprehend information   Neurologic Exam: neurological assessment grossly intact   Other: moves all extremities      Cardiovascular Health Factors  Vitals BP Readings from Last 3 Encounters:   01/29/21 132/78  09/12/20 95/59   07/01/20 105/54     Wt Readings from Last 3 Encounters:   01/29/21 97.1 kg (214 lb)   09/10/20 84.3 kg (185 lb 13.6 oz)   07/01/20 93.9 kg (207 lb)     BMI Readings from Last 3 Encounters:   01/29/21 41.79 kg/m?   09/10/20 36.30 kg/m?   07/01/20 40.43 kg/m?      Smoking Social History     Tobacco Use   Smoking Status Never Smoker   Smokeless Tobacco Never Used      Lipid Profile Cholesterol   Date Value Ref Range Status   08/06/2019 178  Final     HDL   Date Value Ref Range Status   08/06/2019 29 (L) >40 Final     LDL   Date Value Ref Range Status   08/06/2019 93  Final     Triglycerides   Date Value Ref Range Status   08/06/2019 280 (H) <150 Final      Blood Sugar Hemoglobin A1C   Date Value Ref Range Status   05/07/2020 7.2 (H) 4.0 - 6.0 % Final     Comment:     The ADA recommends that most patients with type 1 and type 2 diabetes maintain   an A1c level <7%.       Glucose   Date Value Ref Range Status   01/01/2021 149 (H) 70 - 105 Final   09/12/2020 125 (H) 70 - 100 MG/DL Final   16/09/9603 540 (H) 70 - 100 MG/DL Final     Glucose, POC   Date Value Ref Range Status   09/03/2020 97 70 - 100 MG/DL Final   98/10/9146 829 (H) 70 - 100 MG/DL Final   56/21/3086 578 (H) 70 - 100 MG/DL Final          Problems Addressed Today  Encounter Diagnoses   Name Primary?   ? Atrial fibrillation with rapid ventricular response (HCC)    ? Cardiac pacemaker        Assessment and Plan Atrial fibrillation with rapid ventricular response (HCC)  Her atrial fibrillation has never been particularly symptomatic.  I do think that her increased peripheral edema is likely to be related to her rapid ventricular rate and made sure to have her restart her metoprolol.  I also increased her furosemide dose today to help with the peripheral edema and diastolic heart failure.    Cardiac pacemaker  She had a pacemaker check today and the device appears to be functioning normally.      Current Medications (including today's revisions)  ? acetaminophen (TYLENOL EXTRA STRENGTH) 500 mg tablet Take two tablets by mouth every 8 hours as needed. Max of 4,000 mg of acetaminophen in 24 hours.   ? allopurinol (ZYLOPRIM) 100 mg tablet Take 1 Tab by mouth daily.   ? apixaban (ELIQUIS) 5 mg tablet Take 5 mg by mouth twice daily.   ? cholecalciferol (VITAMIN D-3) 1,000 units tablet Take 2,000 Units by mouth daily.   ? esomeprazole DR (NEXIUM) 20 mg capsule Take 20 mg by mouth every morning.   ? folic acid (FOLVITE) 1 mg tablet Take 1 Tab by mouth daily.   ? furosemide (LASIX) 40 mg tablet Take one tablet by mouth every morning.   ? gabapentin (NEURONTIN) 400 mg capsule Take one capsule by mouth twice daily.   ? leflunomide (ARAVA) 20 mg tablet Take 20 mg by mouth daily.   ? metFORMIN (GLUCOPHAGE) 500 mg tablet Take  1 Tab by mouth twice daily with meals. (Patient taking differently: Take 500 mg by mouth daily.)   ? metoprolol XL (TOPROL XL) 100 mg extended release tablet Take one tablet by mouth daily.   ? nitrofurantoin macrocrystaL (MACRODANTIN) 100 mg capsule Take 100 mg by mouth every 6 hours. Take with food.   Indications: genitourinary tract infections   ? simvastatin (ZOCOR) 80 mg tablet Take 80 mg by mouth at bedtime daily.     Total time spent on today's office visit was 30 minutes.  This includes face-to-face in person visit with patient as well as nonface-to-face time including review of the EMR, outside records, labs, radiologic studies, echocardiogram & other cardiovascular studies, formation of treatment plan, after visit summary, future disposition, and lastly on documentation.

## 2021-01-29 NOTE — Assessment & Plan Note
She had a pacemaker check today and the device appears to be functioning normally.

## 2021-01-29 NOTE — Assessment & Plan Note
Her atrial fibrillation has never been particularly symptomatic.  I do think that her increased peripheral edema is likely to be related to her rapid ventricular rate and made sure to have her restart her metoprolol.  I also increased her furosemide dose today to help with the peripheral edema and diastolic heart failure.

## 2021-02-16 ENCOUNTER — Encounter: Admit: 2021-02-16 | Discharge: 2021-02-16 | Payer: MEDICARE

## 2021-03-26 ENCOUNTER — Emergency Department: Admit: 2021-03-26 | Discharge: 2021-03-26 | Payer: MEDICARE

## 2021-03-26 ENCOUNTER — Encounter: Admit: 2021-03-26 | Discharge: 2021-03-26 | Payer: MEDICARE

## 2021-03-26 ENCOUNTER — Inpatient Hospital Stay: Admit: 2021-03-26 | Payer: MEDICARE

## 2021-03-26 DIAGNOSIS — E785 Hyperlipidemia, unspecified: Secondary | ICD-10-CM

## 2021-03-26 DIAGNOSIS — IMO0002 Ulcer: Secondary | ICD-10-CM

## 2021-03-26 DIAGNOSIS — I509 Heart failure, unspecified: Secondary | ICD-10-CM

## 2021-03-26 DIAGNOSIS — M069 Rheumatoid arthritis, unspecified: Secondary | ICD-10-CM

## 2021-03-26 DIAGNOSIS — R778 Other specified abnormalities of plasma proteins: Secondary | ICD-10-CM

## 2021-03-26 DIAGNOSIS — M25561 Pain in right knee: Secondary | ICD-10-CM

## 2021-03-26 DIAGNOSIS — E119 Type 2 diabetes mellitus without complications: Secondary | ICD-10-CM

## 2021-03-26 DIAGNOSIS — J9601 Acute respiratory failure with hypoxia: Secondary | ICD-10-CM

## 2021-03-26 DIAGNOSIS — M810 Age-related osteoporosis without current pathological fracture: Secondary | ICD-10-CM

## 2021-03-26 DIAGNOSIS — D899 Disorder involving the immune mechanism, unspecified: Secondary | ICD-10-CM

## 2021-03-26 DIAGNOSIS — R0902 Hypoxemia: Secondary | ICD-10-CM

## 2021-03-26 DIAGNOSIS — G5600 Carpal tunnel syndrome, unspecified upper limb: Secondary | ICD-10-CM

## 2021-03-26 DIAGNOSIS — K219 Gastro-esophageal reflux disease without esophagitis: Secondary | ICD-10-CM

## 2021-03-26 DIAGNOSIS — I280 Arteriovenous fistula of pulmonary vessels: Secondary | ICD-10-CM

## 2021-03-26 DIAGNOSIS — M1990 Unspecified osteoarthritis, unspecified site: Secondary | ICD-10-CM

## 2021-03-26 LAB — CBC AND DIFF
Lab: 0 K/UL (ref 0–0.20)
Lab: 0 K/UL (ref 0–0.45)
Lab: 0.4 K/UL (ref 0–0.80)
Lab: 1 % (ref 0–2)
Lab: 22 — ABNORMAL HIGH (ref <20.7)
Lab: 5 % (ref 4–12)
Lab: 5 % — ABNORMAL LOW (ref 24–44)
Lab: 8.1 K/UL — ABNORMAL HIGH (ref 1.8–7.0)
Lab: 9.2 K/UL (ref 4.5–11.0)

## 2021-03-26 LAB — COVID-19 (SARS-COV-2) PCR

## 2021-03-26 LAB — COMPREHENSIVE METABOLIC PANEL
Lab: 136 MMOL/L — ABNORMAL LOW (ref 137–147)
Lab: 20 MMOL/L — ABNORMAL LOW (ref 21–30)
Lab: 3.6 MMOL/L — ABNORMAL LOW (ref 3.5–5.1)
Lab: 6.9 g/dL — AB (ref 6.0–8.0)

## 2021-03-26 LAB — POC TROPONIN
Lab: 0.3 ng/mL — ABNORMAL HIGH (ref 0.00–0.05)
Lab: 0.4 ng/mL — ABNORMAL HIGH (ref 0.00–0.05)

## 2021-03-26 LAB — POC POTASSIUM: Lab: 3.5 MMOL/L (ref 3.5–5.1)

## 2021-03-26 LAB — POC GLUCOSE: Lab: 136 mg/dL — ABNORMAL HIGH (ref 70–100)

## 2021-03-26 LAB — BNP POC ER: Lab: 110 pg/mL — ABNORMAL HIGH (ref 0–100)

## 2021-03-26 LAB — POC SODIUM: Lab: 137 MMOL/L (ref 137–147)

## 2021-03-26 LAB — URINALYSIS MICROSCOPIC REFLEX TO CULTURE

## 2021-03-26 LAB — POC BLOOD GAS VEN
Lab: 23 MMOL/L
Lab: 40 mmHg (ref 36–50)
Lab: 7.3 (ref 7.30–7.40)

## 2021-03-26 LAB — PROTIME INR (PT)
Lab: 2 g/dL — ABNORMAL HIGH (ref 0.8–1.2)
Lab: 23 s — ABNORMAL HIGH (ref 8.5–14.4)

## 2021-03-26 LAB — POC LACTATE
Lab: 3.1 MMOL/L — ABNORMAL HIGH (ref 0.5–2.0)
Lab: 1.8 MMOL/L (ref 0.5–2.0)

## 2021-03-26 LAB — URINALYSIS DIPSTICK REFLEX TO CULTURE: Lab: NEGATIVE mL/min — ABNORMAL LOW (ref 1.0–4.8)

## 2021-03-26 LAB — POC HEMATOCRIT
Lab: 11 g/dL — ABNORMAL LOW (ref 12.0–15.0)
Lab: 33 % — ABNORMAL LOW (ref 36–45)

## 2021-03-26 LAB — TROPONIN-I: Lab: 0.2 ng/mL — ABNORMAL HIGH (ref 0.0–0.05)

## 2021-03-26 MED ORDER — FUROSEMIDE 10 MG/ML IJ SOLN
60 mg | Freq: Two times a day (BID) | INTRAVENOUS | 0 refills | Status: AC
Start: 2021-03-26 — End: ?
  Administered 2021-03-27 (×2): 60 mg via INTRAVENOUS

## 2021-03-26 MED ORDER — HEPARIN (PORCINE) BOLUS FOR CONTINUOUS INF (VIAL)
20-40 [IU]/kg | INTRAVENOUS | 0 refills | Status: AC
Start: 2021-03-26 — End: ?
  Administered 2021-03-27: 22:00:00 2200 [IU] via INTRAVENOUS
  Administered 2021-03-28: 06:00:00 4400 [IU] via INTRAVENOUS
  Administered 2021-03-29: 01:00:00 2200 [IU] via INTRAVENOUS

## 2021-03-26 MED ORDER — ALLOPURINOL 100 MG PO TAB
100 mg | Freq: Every day | ORAL | 0 refills | Status: AC
Start: 2021-03-26 — End: ?
  Administered 2021-03-27 – 2021-04-06 (×11): 100 mg via ORAL

## 2021-03-26 MED ORDER — IOHEXOL 350 MG IODINE/ML IV SOLN
80 mL | Freq: Once | INTRAVENOUS | 0 refills | Status: CP
Start: 2021-03-26 — End: ?
  Administered 2021-03-26: 23:00:00 80 mL via INTRAVENOUS

## 2021-03-26 MED ORDER — SIMVASTATIN 40 MG PO TAB
80 mg | Freq: Every evening | ORAL | 0 refills | Status: AC
Start: 2021-03-26 — End: ?
  Administered 2021-03-27: 03:00:00 80 mg via ORAL

## 2021-03-26 MED ORDER — ONDANSETRON 4 MG PO TBDI
4 mg | ORAL | 0 refills | Status: AC | PRN
Start: 2021-03-26 — End: ?

## 2021-03-26 MED ORDER — HYOSCYAMINE SULFATE 0.125 MG PO TBDI
.125 mg | SUBLINGUAL | 0 refills | Status: AC | PRN
Start: 2021-03-26 — End: ?
  Administered 2021-03-27: 05:00:00 0.125 mg via SUBLINGUAL

## 2021-03-26 MED ORDER — SENNOSIDES-DOCUSATE SODIUM 8.6-50 MG PO TAB
1 | Freq: Every day | ORAL | 0 refills | Status: AC | PRN
Start: 2021-03-26 — End: ?

## 2021-03-26 MED ORDER — FOLIC ACID 1 MG PO TAB
1 mg | Freq: Every day | ORAL | 0 refills | Status: AC
Start: 2021-03-26 — End: ?
  Administered 2021-03-27 – 2021-04-06 (×11): 1 mg via ORAL

## 2021-03-26 MED ORDER — GABAPENTIN 300 MG PO CAP
300 mg | Freq: Once | ORAL | 0 refills | Status: CP
Start: 2021-03-26 — End: ?
  Administered 2021-03-26: 21:00:00 300 mg via ORAL

## 2021-03-26 MED ORDER — HEPARIN (PORCINE) IN 5 % DEX 20,000 UNIT/500 ML (40 UNIT/ML) IV SOLP
0-2000 [IU]/h | INTRAVENOUS | 0 refills | Status: DC
Start: 2021-03-26 — End: 2021-03-27

## 2021-03-26 MED ORDER — ONDANSETRON HCL (PF) 4 MG/2 ML IJ SOLN
4 mg | INTRAVENOUS | 0 refills | Status: AC | PRN
Start: 2021-03-26 — End: ?

## 2021-03-26 MED ORDER — CEFTRIAXONE INJ 1GM IVP
1 g | INTRAVENOUS | 0 refills | Status: AC
Start: 2021-03-26 — End: ?
  Administered 2021-03-27: 05:00:00 1 g via INTRAVENOUS

## 2021-03-26 MED ORDER — FUROSEMIDE 10 MG/ML IJ SOLN
40 mg | Freq: Once | INTRAVENOUS | 0 refills | Status: CP
Start: 2021-03-26 — End: ?
  Administered 2021-03-26: 21:00:00 40 mg via INTRAVENOUS

## 2021-03-26 MED ORDER — APIXABAN 5 MG PO TAB
5 mg | Freq: Two times a day (BID) | ORAL | 0 refills | Status: DC
Start: 2021-03-26 — End: 2021-03-27
  Administered 2021-03-27: 03:00:00 5 mg via ORAL

## 2021-03-26 MED ORDER — FENTANYL CITRATE (PF) 50 MCG/ML IJ SOLN
25-50 ug | INTRAVENOUS | 0 refills | Status: CP | PRN
Start: 2021-03-26 — End: ?
  Administered 2021-03-26 – 2021-03-27 (×2): 50 ug via INTRAVENOUS

## 2021-03-26 MED ORDER — METOPROLOL SUCCINATE 100 MG PO TB24
100 mg | Freq: Every day | ORAL | 0 refills | Status: AC
Start: 2021-03-26 — End: ?

## 2021-03-26 MED ORDER — HEPARIN (PORCINE) 1,000 UNIT/ML IJ SOLN
4000 [IU] | Freq: Once | INTRAVENOUS | 0 refills | Status: AC
Start: 2021-03-26 — End: ?
  Administered 2021-03-27: 14:00:00 4000 [IU] via INTRAVENOUS

## 2021-03-26 MED ORDER — POLYETHYLENE GLYCOL 3350 17 GRAM PO PWPK
1 | Freq: Every day | ORAL | 0 refills | Status: AC | PRN
Start: 2021-03-26 — End: ?

## 2021-03-26 MED ORDER — HEPARIN (PORCINE) 1,000 UNIT/ML IJ SOLN
4000 [IU] | Freq: Once | INTRAVENOUS | 0 refills | Status: DC
Start: 2021-03-26 — End: 2021-03-27

## 2021-03-26 MED ORDER — MELATONIN 5 MG PO TAB
5 mg | Freq: Every evening | ORAL | 0 refills | Status: AC | PRN
Start: 2021-03-26 — End: ?
  Administered 2021-03-27 – 2021-03-31 (×3): 5 mg via ORAL

## 2021-03-26 MED ORDER — HYDROCODONE-ACETAMINOPHEN 5-325 MG PO TAB
1 | ORAL | 0 refills | Status: AC | PRN
Start: 2021-03-26 — End: ?
  Administered 2021-03-27 – 2021-04-06 (×11): 1 via ORAL

## 2021-03-26 MED ORDER — GABAPENTIN 400 MG PO CAP
400 mg | Freq: Two times a day (BID) | ORAL | 0 refills | Status: AC
Start: 2021-03-26 — End: ?
  Administered 2021-03-27 – 2021-04-06 (×34): 400 mg via ORAL

## 2021-03-26 MED ORDER — PANTOPRAZOLE 20 MG PO TBEC
20 mg | Freq: Every day | ORAL | 0 refills | Status: AC
Start: 2021-03-26 — End: ?
  Administered 2021-03-27 – 2021-04-01 (×6): 20 mg via ORAL

## 2021-03-26 MED ORDER — SODIUM CHLORIDE 0.9 % IJ SOLN
50 mL | Freq: Once | INTRAVENOUS | 0 refills | Status: CP
Start: 2021-03-26 — End: ?
  Administered 2021-03-26: 23:00:00 50 mL via INTRAVENOUS

## 2021-03-26 MED ORDER — HEPARIN (PORCINE) IN 5 % DEX 20,000 UNIT/500 ML (40 UNIT/ML) IV SOLP
0-2000 [IU]/h | INTRAVENOUS | 0 refills | Status: AC
Start: 2021-03-26 — End: ?
  Administered 2021-03-27: 14:00:00 1000 [IU]/h via INTRAVENOUS
  Administered 2021-03-28 – 2021-03-29 (×2): 1300 [IU]/h via INTRAVENOUS
  Administered 2021-03-29 – 2021-03-30 (×2): 1400 [IU]/h via INTRAVENOUS

## 2021-03-26 NOTE — ED Notes
Repeat POC trop and Lactate running at bedside.

## 2021-03-26 NOTE — ED Provider Notes
Madison Johnston is a 75 y.o. female.    Chief Complaint:  Chief Complaint   Patient presents with   ? Leg Pain       History of Present Illness:  Madison Johnston is a 75 y.o. female, with a history of HTN, HLD, CHF, a-fib, OSA, RA, GERD, and osteoarthritis who presents to the emergency department for leg pain. Patient was at home today. Patient states states she normally sleeps in her chair d/t chronic leg pain. Patient states her daughter decided that the patient should lay in her bed this morning around 0700 and when she returned at 1200 patient was reporting increase leg pain and patient stated she was unable to get out of bed. Patient states she was also dizzy and short of breath so she called EMS. Per EMS: patient was noted to be in a-fib with RVR so patient was Cardizem bolus was administered returning patient to her base rate. Patient was noted with SpO2 84% on RA and 79% RA after arriving to the ED. Patient states her legs have been more swollen recently. Patient states she was unable to take her medications this morning d/t being in bed. Patient states the leg pain is likely secondary to missing her gabapentin dose. 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       History provided by:  Patient and medical records  Language interpreter used: No    Leg Pain  Associated symptoms: fatigue    Associated symptoms: no back pain and no fever        Review of Systems:  Review of Systems   Constitutional: Positive for activity change and fatigue. Negative for fever.   HENT: Negative for sore throat.    Eyes: Positive for visual disturbance.   Respiratory: Positive for shortness of breath. Negative for cough and stridor.    Cardiovascular: Positive for leg swelling. Negative for chest pain.   Gastrointestinal: Negative for abdominal pain, nausea and vomiting.   Genitourinary: Negative for dysuria. Musculoskeletal: Positive for gait problem and myalgias. Negative for back pain.   Skin: Negative for rash.   Neurological: Positive for weakness. Negative for headaches.       Allergies:  Patient has no known allergies.    Past Medical History:  Medical History:   Diagnosis Date   ? Arthritis    ? Carpal tunnel syndrome    ? DM (diabetes mellitus) (HCC)    ? Essential hypertension    ? GERD (gastroesophageal reflux disease)    ? Hyperlipidemia    ? Immune disorder (HCC)     RA   ? 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 Osteoarthritis    ? Osteoporosis    ? Rheumatoid arthritis (HCC)    ? 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 Ulcer        Past Surgical History:  Surgical History:   Procedure Laterality Date   ? COLOSTOMY  2012    with sigmoidectomy   ? 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 SALPINGO-OOPHORECTOMY  August 2012    Done while in OR with sigmoidectomy from abscess adhesion   ? HUMERUS FRACTURE SURGERY Left 08/2013   ? LEFT PRIMARY TOTAL KNEE ARTHROPLASTY  Left 07/08/2015    Performed by Simonne Maffucci, MD at [facility] OR   ? ARTHROCENTESIS/ ASPIRATION/ INJECTION MAJOR JOINT/ BURSA Left 05/09/2020    Performed by Heddings, Revonda Standard, MD at [facility] OR   ? INCISION AND DRAINAGE ISCHIORECTAL/ PERIRECTAL ABSCESS  IRRIGATION AND DEBRIDEMENT N/A 05/13/2020    Performed by Guinevere Scarlet, MD at [facility] OR   ? TRANSCATHETER INSERTION/ REPLACEMENT RIGHT VENTRICULAR PERMANENT LEADLESS PACEMAKER WITH/ WITHOUT?IMAGE GUIDANCE/ DEVICE EVALUATION N/A 06/06/2020    Performed by Jen Mow, MD at [facility] CATH LAB   ? HX APPENDECTOMY  75 yrs old   ? HX CARPAL TUNNEL RELEASE Bilateral    ? HX KNEE SURGERY Right     total knee arthroplasty   ? HX SHOULDER SURGERY Right     9 pins, plate, and bone graft       Pertinent medical/surgical history reviewed  Medical History:   Diagnosis Date   ? Arthritis ? Carpal tunnel syndrome    ? DM (diabetes mellitus) (HCC)    ? Essential hypertension    ? GERD (gastroesophageal reflux disease)    ? Hyperlipidemia    ? Immune disorder (HCC)     RA   ? 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 Osteoarthritis    ? Osteoporosis    ? Rheumatoid arthritis (HCC)    ? 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 Ulcer      Surgical History:   Procedure Laterality Date   ? COLOSTOMY  2012    with sigmoidectomy   ? 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 SALPINGO-OOPHORECTOMY  August 2012    Done while in OR with sigmoidectomy from abscess adhesion   ? HUMERUS FRACTURE SURGERY Left 08/2013   ? LEFT PRIMARY TOTAL KNEE ARTHROPLASTY Left 07/08/2015    Performed by Simonne Maffucci, MD at [facility] OR   ? ARTHROCENTESIS/ ASPIRATION/ INJECTION MAJOR JOINT/ BURSA Left 05/09/2020    Performed by Heddings, Revonda Standard, MD at [facility] OR   ? INCISION AND DRAINAGE ISCHIORECTAL/ PERIRECTAL ABSCESS  IRRIGATION AND DEBRIDEMENT N/A 05/13/2020    Performed by Guinevere Scarlet, MD at [facility] OR   ? TRANSCATHETER INSERTION/ REPLACEMENT RIGHT VENTRICULAR PERMANENT LEADLESS PACEMAKER WITH/ WITHOUT?IMAGE GUIDANCE/ DEVICE EVALUATION N/A 06/06/2020    Performed by Jen Mow, MD at [facility] CATH LAB   ? HX APPENDECTOMY  75 yrs old   ? HX CARPAL TUNNEL RELEASE Bilateral    ? HX KNEE SURGERY Right     total knee arthroplasty   ? HX SHOULDER SURGERY Right     9 pins, plate, and bone graft       Social History:  Social History     Tobacco Use   ? Smoking status: Never Smoker   ? Smokeless tobacco: Never Used   Vaping Use   ? Vaping Use: Unknown   Substance Use Topics   ? Alcohol use: No   ? Drug use: No     Social History     Substance and Sexual Activity   Drug Use No             Family History:  Family History   Adopted: Yes       Vitals:  ED Vitals    Date and Time T BP P  RR SPO2P SPO2 User   03/26/21 2057 36.3 ?C (97.4 ?F) 115/84 128 22 PER MINUTE -- 97 % RC   03/26/21 2030 -- 111/81 127 24 PER MINUTE 139 98 % OK   03/26/21 1952 -- -- 109 22 PER MINUTE 108 94 % [facility]   03/26/21 1930 -- 118/93 122 23 PER MINUTE 116 97 % SH   03/26/21 1800 -- 113/92 116 21 PER MINUTE 117 -- JD   03/26/21 1630 -- 107/79 104 23 PER MINUTE 99 99 % JD   03/26/21 1555 37.5 ?C (99.5 ?F) -- -- -- -- -- JD   03/26/21 1506 -- 128/96 94 -- --  92 % JD   03/26/21 1504 -- -- -- -- 90  79 % JD          Physical Exam:  Physical Exam  Vitals and nursing note reviewed.   Constitutional:       Appearance: Normal appearance. She is normal weight.   HENT:      Head: Normocephalic and atraumatic.      Right Ear: External ear normal.      Left Ear: External ear normal.   Eyes:      Extraocular Movements: Extraocular movements intact.      Conjunctiva/sclera: Conjunctivae normal.   Cardiovascular:      Rate and Rhythm: Normal rate. Rhythm regularly irregular.      Pulses: Normal pulses.           Dorsalis pedis pulses are 2+ on the right side and 2+ on the left side.      Heart sounds: Normal heart sounds.   Pulmonary:      Effort: Tachypnea present.      Breath sounds: Normal breath sounds.   Abdominal:      General: Bowel sounds are normal. There is distension.      Palpations: Abdomen is soft.      Tenderness: There is no abdominal tenderness.      Comments: LUQ colostomy well appearing   Musculoskeletal:      Cervical back: Neck supple.      Right knee: Decreased range of motion.      Left knee: Decreased range of motion.      Right lower leg: No tenderness or bony tenderness. 4+ Pitting Edema present.      Left lower leg: No tenderness or bony tenderness. 4+ Pitting Edema present.   Neurological:      Mental Status: She is oriented to person, place, and time. Mental status is at baseline.   Psychiatric:         Mood and Affect: Mood normal.         Behavior: Behavior normal.         Laboratory Results:  Labs Reviewed   POC HEMATOCRIT - Abnormal Result Value Ref Range Status    Hemoglobin POC 11.2 (*) 12.0 - 15.0 GM/DL Final    Hematocrit POC 33.0 (*) 36 - 45 % Final   POC CREATININE, RAD - Abnormal    Creatinine, POC 1.3 (*) 0.4 - 1.00 MG/DL Final   POC LACTATE - Abnormal    LACTIC ACID POC 3.1 (*) 0.5 - 2.0 MMOL/L Final   POC TROPONIN - Abnormal    Troponin-I-POC 0.37 (*) 0.00 - 0.05 NG/ML Final   BNP POC ER - Abnormal    BNP POC 1,109.0 (*) 0 - 100 PG/ML Final   CBC AND DIFF - Abnormal    White Blood Cells 9.2  4.5 - 11.0 K/UL Final    RBC 3.94 (*) 4.0 - 5.0 M/UL Final    Hemoglobin 9.9 (*) 12.0 - 15.0 GM/DL Final    Hematocrit 16.1 (*) 36 - 45 % Final    MCV 80.5  80 - 100 FL Final    MCH 25.2 (*) 26 - 34 PG Final    MCHC 31.3 (*) 32.0 - 36.0 G/DL Final    RDW 09.6 (*) 11 - 15 % Final    Platelet Count 349  150 - 400 K/UL Final    MPV 7.6  7 - 11 FL Final    Neutrophils 88 (*) 41 - 77 % Final    Lymphocytes 5 (*) 24 - 44 % Final    Monocytes 5  4 - 12 % Final    Eosinophils 1  0 - 5 % Final    Basophils 1  0 - 2 % Final    Absolute Neutrophil Count 8.13 (*) 1.8 - 7.0 K/UL Final    Absolute Lymph Count 0.44 (*) 1.0 - 4.8 K/UL Final    Absolute Monocyte Count 0.47  0 - 0.80 K/UL Final    Absolute Eosinophil Count 0.08  0 - 0.45 K/UL Final    Absolute Basophil Count 0.08  0 - 0.20 K/UL Final    MDW (Monocyte Distribution Width) 22.8 (*) <20.7 Final   COMPREHENSIVE METABOLIC PANEL - Abnormal    Sodium 136 (*) 137 - 147 MMOL/L Final    Potassium 3.6  3.5 - 5.1 MMOL/L Final    Chloride 102  98 - 110 MMOL/L Final    Glucose 142 (*) 70 - 100 MG/DL Final    Blood Urea Nitrogen 28 (*) 7 - 25 MG/DL Final    Creatinine 0.45 (*) 0.4 - 1.00 MG/DL Final    Calcium 8.6  8.5 - 10.6 MG/DL Final    Total Protein 6.9  6.0 - 8.0 G/DL Final    Total Bilirubin 0.7  0.3 - 1.2 MG/DL Final    Albumin 3.1 (*) 3.5 - 5.0 G/DL Final    Alk Phosphatase 105  25 - 110 U/L Final    AST (SGOT) 23  7 - 40 U/L Final    CO2 20 (*) 21 - 30 MMOL/L Final    ALT (SGPT) 9  7 - 56 U/L Final Anion Gap 14 (*) 3 - 12 Final    eGFR 42 (*) >60 mL/min Final   TROPONIN-I - Abnormal    Troponin-I 0.27 (*) 0.0 - 0.05 NG/ML Final   URINALYSIS DIPSTICK REFLEX TO CULTURE - Abnormal    Color,UA YELLOW   Final    Turbidity,UA CLEAR  CLEAR-CLEAR Final    Specific Gravity-Urine 1.011  1.005 - 1.030 Final    pH,UA 6.0  5.0 - 8.0 Final    Protein,UA 1+ (*) NEG-NEG Final    Glucose,UA NEG  NEG-NEG Final    Ketones,UA NEG  NEG-NEG Final    Bilirubin,UA NEG  NEG-NEG Final    Blood,UA NEG  NEG-NEG Final    Urobilinogen,UA NORMAL  NORM-NORMAL Final    Nitrite,UA NEG  NEG-NEG Final    Leukocytes,UA TRACE (*) NEG-NEG Final    Urine Ascorbic Acid, UA NEG  NEG-NEG Final   URINALYSIS MICROSCOPIC REFLEX TO CULTURE - Abnormal    WBCs,UA 2-10  0 - 2 /HPF Final    RBCs,UA 0-2  0 - 3 /HPF Final    Comment,UA     Final  Value: Criteria for reflex to culture are WBC>10, Positive Nitrite, and/or >=+1   leukocytes. If quantity is not sufficient, an addendum will follow.      MucousUA TRACE   Final    Bacteria,UA PACKED (*) NEG-NEG Final    Squamous Epithelial Cells 2-5  0 - 5 Final   PROTIME INR (PT) - Abnormal    Protime 23.1 (*) 8.5 - 14.4 SEC Final    INR 2.0 (*) 0.8 - 1.2 Final   POC TROPONIN - Abnormal    Troponin-I-POC 0.49 (*) 0.00 - 0.05 NG/ML Final   POC TROPONIN - Abnormal    Troponin-I-POC 0.56 (*) 0.00 - 0.05 NG/ML Final   COVID-19 (SARS-COV-2) PCR    COVID-19 (SARS-CoV-2) PCR Source     Corrected    Value: FLOCKED SWAB  NASOPHARYNGEAL      COVID-19 (SARS-CoV-2) PCR NOT DETECTED  DN-NOT DETECTED Final   POC BLOOD GAS VEN    PH-VEN-POC 7.38  7.30 - 7.40 Final    PCO2-VEN-POC 40  36 - 50 MMHG Final    PO2-VEN-POC 33  33 - 48 MMHG Final    Base Def-VEN-POC 2.0  MMOL/L Final    O2 Sat-VEN-POC 62.0  55 - 71 % Final    Bicarbonate-VEN-POC 23.5  MMOL/L Final   POC POTASSIUM    Potassium-POC 3.5  3.5 - 5.1 MMOL/L Final   POC SODIUM    Sodium-POC 137  137 - 147 MMOL/L Final   POC LACTATE    LACTIC ACID POC 1.8  0.5 - 2.0 MMOL/L Final UA REFLEX LABEL   URINE CLEAR TOP TUBE   POC TROPONIN          Radiology Interpretation:    CTA CHEST WO/W CONTRAST+POST P   Final Result         1.  No acute pulmonary embolus.   2.  Mild cardiomegaly, pulmonary vascular congestion, and smooth interlobular septal thickening suggestive of CHF/fluid overload.   3.  Small bilateral pleural effusions with adjacent atelectasis, likely additional sequela of CHF/fluid overload.   4.  Mild coronary artery calcification, dense mitral valve annular calcification, and mild calcification of the aortic valve leaflets.   5.  Mildly dilated main pulmonary artery which can be seen with pulmonary hypertension.   6.  Diffuse hepatic steatosis.   7.  Soft tissue nodule within the right breast, further evaluation with mammogram if not recently performed is suggested.      By my electronic signature, I attest that I have personally reviewed the images for this examination and formulated the interpretations and opinions expressed in this report          Finalized by Earlyne Iba, MD, PhD on 03/26/2021 6:57 PM. Dictated by Rubbie Battiest, D.O. on 03/26/2021 6:16 PM.         CT HEAD WO CONTRAST   Final Result      CHEST SINGLE VIEW   Final Result      Mild cardiomegaly with findings of CHF/volume overload. Small bilateral pleural effusions.          Finalized by Kathyrn Lass, M.D. on 03/26/2021 4:46 PM. Dictated by Kathyrn Lass, M.D. on 03/26/2021 4:44 PM.         2D + DOPPLER ECHO    (Results Pending)         EKG:    A fib rate of 104  No STEMI  Old infarcts noted    ED Course:  Patient presents to ED  with complaints right leg pain that she feels started when her daughter put her on her bed and left her there for the last 5 hours.  Patient is also with a newer oxygen requirement that was noticed by EMS.  She was also in A. fib RVR, currently now in just A. Fib.  Patient also states that she has had some generalized weakness.  She feels like she is fluid overloaded.  Also reports that she has this left eye loss of vision that happened 2 days ago.  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    Basic labs were drawn including POC's troponin, BNP, lactate and creatinine.  Chest x-ray and EKG was done  Patient was given some IV Lasix here for elevated BNP.  Patient also has elevated troponin level.  Will worry about possible PE and send her for CTA of the chest.  Due to this left retinal artery occlusion we will also get a CT of the head without contrast.  Ophthalmology was also consulted on this.    Patient with continued rising troponins.  Will consult cardiology for this.  Patient's CTA did not show any pulmonary embolism at this time.  Patient is now complaining of pain after she was sent to CT in her leg and in her back.  We will just add some fentanyl for pain.  Discussed with cardiology about  Rising troponins.  They felt like she could still be admitted to medicine for continued heart failure work-up and possibly start a heparin drip if her troponins kept rising.  Patient stable throughout ED stay, remains on oxygen.  Will be admitted to medicine for further work-up.  Ophthalmology is also recommending a neurologic stroke work-up as well while inpatient for this retinal occlusion.    ED Scoring:                                MDM  Reviewed: nursing note, previous chart and vitals  Reviewed previous: labs and ECG  Interpretation: labs, ECG and x-ray        Facility Administered Meds:  Medications   furosemide (LASIX) injection 40 mg (40 mg Intravenous Given 03/26/21 1620)   gabapentin (NEURONTIN) capsule 300 mg (300 mg Oral Given 03/26/21 1620)   iohexoL (OMNIPAQUE-350) 350 mg/mL injection 80 mL (80 mL Intravenous Given 03/26/21 1814)   sodium chloride PF 0.9% injection 50 mL (50 mL Intravenous Given 03/26/21 1814)   fentaNYL citrate PF (SUBLIMAZE) injection 25-50 mcg (50 mcg Intravenous Given 03/26/21 1856)         Clinical Impression:  Clinical Impression   Acute on chronic congestive heart failure, unspecified heart failure type (HCC)   Arthralgia of right lower leg   Hypoxia   Troponin level elevated       Disposition/Follow up  ED Disposition     ED Disposition    Admit        No follow-up provider specified.    Medications:  Current Discharge Medication List          Procedure Notes:  Procedures      Attestation / Supervision:  Jeronimo Norma, am scribing for and in the presence of Sherlean Foot, APRN.      Daryel November    Attestation / Supervision Note concerning Tomasa Rand: ---------- and Trilby Leaver, APRN-NP, personally performed the services described in this documentation as scribed  in my presence and it is both accurate and complete.    Jennye Boroughs, APRN-NP

## 2021-03-27 ENCOUNTER — Inpatient Hospital Stay: Admit: 2021-03-27 | Discharge: 2021-03-27 | Payer: MEDICARE

## 2021-03-27 ENCOUNTER — Encounter: Admit: 2021-03-27 | Discharge: 2021-03-27 | Payer: MEDICARE

## 2021-03-27 DIAGNOSIS — M069 Rheumatoid arthritis, unspecified: Secondary | ICD-10-CM

## 2021-03-27 DIAGNOSIS — E119 Type 2 diabetes mellitus without complications: Secondary | ICD-10-CM

## 2021-03-27 DIAGNOSIS — IMO0002 Ulcer: Secondary | ICD-10-CM

## 2021-03-27 DIAGNOSIS — E785 Hyperlipidemia, unspecified: Secondary | ICD-10-CM

## 2021-03-27 DIAGNOSIS — M1990 Unspecified osteoarthritis, unspecified site: Secondary | ICD-10-CM

## 2021-03-27 DIAGNOSIS — M810 Age-related osteoporosis without current pathological fracture: Secondary | ICD-10-CM

## 2021-03-27 DIAGNOSIS — K219 Gastro-esophageal reflux disease without esophagitis: Secondary | ICD-10-CM

## 2021-03-27 DIAGNOSIS — D899 Disorder involving the immune mechanism, unspecified: Secondary | ICD-10-CM

## 2021-03-27 DIAGNOSIS — G5600 Carpal tunnel syndrome, unspecified upper limb: Secondary | ICD-10-CM

## 2021-03-27 DIAGNOSIS — I280 Arteriovenous fistula of pulmonary vessels: Secondary | ICD-10-CM

## 2021-03-27 MED ADMIN — MIDODRINE 2.5 MG PO TAB [10609]: 2.5 mg | ORAL | @ 14:00:00 | NDC 00904681761

## 2021-03-27 MED ADMIN — MAGNESIUM SULFATE IN D5W 1 GRAM/100 ML IV PGBK [166578]: 1 g | INTRAVENOUS | @ 14:00:00 | Stop: 2021-03-27 | NDC 00409672723

## 2021-03-27 MED ADMIN — SODIUM CHLORIDE 0.9 % IV PGBK (MB+) [95161]: 4.5 g | INTRAVENOUS | @ 21:00:00 | Stop: 2021-03-27 | NDC 00338055318

## 2021-03-27 MED ADMIN — PERFLUTREN LIPID MICROSPHERES 1.1 MG/ML IV SUSP [79178]: 2 mL | INTRAVENOUS | @ 17:00:00 | Stop: 2021-03-27 | NDC 11994001116

## 2021-03-27 MED ADMIN — METOPROLOL SUCCINATE 100 MG PO TB24 [78380]: 100 mg | ORAL | @ 09:00:00 | Stop: 2021-03-27 | NDC 00904632461

## 2021-03-27 MED ADMIN — MAGNESIUM SULFATE IN D5W 1 GRAM/100 ML IV PGBK [166578]: 1 g | INTRAVENOUS | @ 17:00:00 | Stop: 2021-03-27 | NDC 00409672723

## 2021-03-27 MED ADMIN — VANCOMYCIN 5 GRAM IV SOLR [8444]: 2000 mg | INTRAVENOUS | @ 22:00:00 | Stop: 2021-03-27 | NDC 00409650901

## 2021-03-27 MED ADMIN — POTASSIUM CHLORIDE 20 MEQ PO TBTQ [35943]: 60 meq | ORAL | @ 14:00:00 | Stop: 2021-03-27 | NDC 00904708661

## 2021-03-27 MED ADMIN — SODIUM CHLORIDE 0.9 % IV SOLP [27838]: 2000 mg | INTRAVENOUS | @ 22:00:00 | Stop: 2021-03-27 | NDC 00338004902

## 2021-03-27 MED ADMIN — MIDODRINE 5 MG PO TAB [10610]: 2.5 mg | ORAL | @ 18:00:00 | NDC 51079045301

## 2021-03-27 MED ADMIN — ASPIRIN 81 MG PO TBEC [14113]: 81 mg | ORAL | @ 14:00:00 | NDC 00536123441

## 2021-03-27 MED ADMIN — NOREPINEPHRINE BITARTRATE-NACL 4 MG/250 ML (16 MCG/ML) IV SOLN [173409]: 0.02 ug/kg/min | INTRAVENOUS | @ 23:00:00 | NDC 70004077140

## 2021-03-27 MED ADMIN — LIDOCAINE 5 % TP PTMD [80759]: 2 | TOPICAL | @ 21:00:00 | NDC 00591352530

## 2021-03-27 MED ADMIN — BUMETANIDE 0.25 MG/ML IJ SOLN [9308]: 2 mg | INTRAVENOUS | @ 21:00:00 | Stop: 2021-03-27 | NDC 00641600801

## 2021-03-27 MED ADMIN — ATORVASTATIN 40 MG PO TAB [77113]: 40 mg | ORAL | @ 14:00:00 | NDC 00904629261

## 2021-03-27 MED ADMIN — PIPERACILLIN-TAZOBACTAM 4.5 GRAM IV SOLR [80419]: 4.5 g | INTRAVENOUS | @ 21:00:00 | Stop: 2021-03-27 | NDC 00409339011

## 2021-03-27 MED ADMIN — BUMETANIDE 0.25 MG/ML IJ SOLN [9308]: 1 mg/h | INTRAVENOUS | @ 21:00:00 | NDC 00641600701

## 2021-03-27 NOTE — H&P (View-Only)
Admission History and Physical Examination      Name:  Madison Johnston                                             MRN:  [id number]   Admission Date:  03/26/2021                     Assessment/Plan:    Madison Johnston is a 75 y.o. female with history of rheumatoid arthritis, morbid obesity, hyperlipidemia, primary hypertension, diabetes mellitus and atrial fibrillation presented to the emergency room with leg pain and dyspnea    Acute HFpEF  Acute hypoxic respiratory failure  - Pulmonary edema, lower extremity edema, elevated BNP and JVD on exam as well as new oxygen requirement  - Last echo in May 2021 with preserved EF.  - Denies chest pain but does report ongoing malaise and dyspnea.  Reports compliance with medications at home, but the last cardiology note raises concern about noncompliance.  Denies excessive salt intake.  - IV Lasix 60 mg twice daily for now.  - Repeat 2D echo tomorrow morning.  - Cardiology consulted    Troponin elevation  - Discussed with cardiology on-call by ER, and reportedly no concern for ACS but troponin elevation more likely to be due to volume overload and reduced renal clearance.  - Trend troponin every 6 hours till downtrending  - If continues to worsen, will start IV heparin as patient with very vague symptoms and could be related to angina.  - 2D echo    AKI  - Likely cardiorenal given gross volume overload on exam  - Monitor carefully while diuresing  - Urine analysis with some protein but no casts    Symptomatic bacteriuria  - Reports bladder spasms and urge incontinence over the past several days.  - No inflammation on UA but does significant bacteria.  - Send urine culture and start empiric Rocephin   - Levsin for bladder spasms    A. fib with RVR  - Likely related to volume overload as well as medication noncompliance  - Resume metoprolol 100 mg daily.  - Cardiology consulted.  - Eliquis maintained    Monocular loss of vision  - Ophthalmology consult underway  - Symptom onset 4 days ago with no recent change.  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  - Admit to med-private with heart failure consultation.    Total 70 minutes spent in patient care performing history and physical, clarifying medical history, reviewing prior visits with patient, checking facts, reviewing recommendations, placing orders for admission, 35 minutes were spent in counseling and coordination of care.  __________________________________________________________________________________  Primary Care Physician: Burnadette Pop  Verified    Chief Complaint:  Malaise, dyspnea.     History of Present Illness: Madison Johnston is a 75 y.o. female with history of rheumatoid arthritis, morbid obesity, hyperlipidemia, primary hypertension, diabetes mellitus and atrial fibrillation presented to the emergency room with leg pain and dyspnea.  Patient is accompanied by her daughter who provides some of the history.  Patient herself reports feeling unwell  and is unable to pinpoint to 1 thing that makes her feel poorly.  Per daughter, patient was in her usual state of health till last night.  But then progressively over the morning and the rest of the day her right leg pain worsened and she was unable to get out of bed.  The patient reports that she was also lightheaded and severely dyspneic so she called EMS.  Upon arrival of EMS, patient was noted to be in A. fib with RVR and so Cardizem was administered.  Patient was also noted to be 79% on room air with no baseline oxygen requirement and was started on supplemental oxygen.  Patient reports compliance with medications although this morning she was unable to take her doses due to not feeling well. 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  There has been no change in her vision since then.  Denies headache.  No temporal pain.  No jaw claudication.  Upon my evaluation, patient is reporting feeling unwell and nauseated.  She is intermittently describing dyspnea and chest tightness.  She is denying any pain anywhere in her body at this time.  Her right leg feels better per patient at this time.  She is able to provide most of her medical history and was able to confirm other facts upon review of records.  She reports reduced ostomy output due to the fact that she has not eaten all of the day today.  She denies any baseline oxygen requirements.  Oxygen is helping her feel better right now but she still does not feel as well as she normally does at baseline.  No fever or chills.  No dysuria per se, but does report urge incontinence despite voiding it with the external catheter.  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    Medical History:   Diagnosis Date   ? Arthritis    ? Carpal tunnel syndrome    ? DM (diabetes mellitus) (HCC)    ? Essential hypertension    ? GERD (gastroesophageal reflux disease)    ? Hyperlipidemia    ? Immune disorder (HCC)     RA   ? 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 Osteoarthritis    ? Osteoporosis    ? Rheumatoid arthritis (HCC)    ? 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 Ulcer      Surgical History:   Procedure Laterality Date   ? COLOSTOMY  2012    with sigmoidectomy   ? 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 SALPINGO-OOPHORECTOMY  August 2012    Done while in OR with sigmoidectomy from abscess adhesion   ? HUMERUS FRACTURE SURGERY Left 08/2013   ? LEFT PRIMARY TOTAL KNEE ARTHROPLASTY Left 07/08/2015    Performed by Simonne Maffucci, MD at [facility] OR   ? ARTHROCENTESIS/ ASPIRATION/ INJECTION MAJOR JOINT/ BURSA Left 05/09/2020    Performed by Heddings, Revonda Standard, MD at [facility] OR   ? INCISION AND DRAINAGE ISCHIORECTAL/ PERIRECTAL ABSCESS  IRRIGATION AND DEBRIDEMENT N/A 05/13/2020    Performed by Guinevere Scarlet, MD at [facility] OR   ? TRANSCATHETER INSERTION/ REPLACEMENT RIGHT VENTRICULAR PERMANENT LEADLESS PACEMAKER WITH/ WITHOUT?IMAGE GUIDANCE/ DEVICE  EVALUATION N/A 06/06/2020    Performed by Jen Mow, MD at [facility] CATH LAB   ? HX APPENDECTOMY  75 yrs old   ? HX CARPAL TUNNEL RELEASE Bilateral    ? HX KNEE SURGERY Right     total knee arthroplasty   ? HX SHOULDER SURGERY Right     9 pins, plate, and bone graft     Family History   Adopted: Yes     Social History     Socioeconomic History   ? Marital status: Widowed     Spouse name: Not on file   ? Number of children: Not on file   ? Years of education: Not on file   ? Highest education level: Not on file   Occupational History   ? Not on file   Tobacco Use   ? Smoking status: Never Smoker   ? Smokeless tobacco: Never Used   Vaping Use   ? Vaping Use: Unknown   Substance and Sexual Activity   ? Alcohol use: No   ? Drug use: No   ? Sexual activity: Not on file   Other Topics Concern   ? Not on file   Social History Narrative   ? Not on file      Immunizations (includes history and patient reported):   Immunization History   Administered Date(s) Administered   ? COVID-19 (PFIZER), mRNA vacc, 30 mcg/0.3 mL (PF) 09/10/2020, 10/09/2020   ? Flu Vaccine Quadrivalent =>3 Yo (Preservative Free) 09/12/2013           Allergies:  Patient has no known allergies.    Medications:  Prior to Admission Medications   Prescriptions Last Dose Informant Patient Reported? Taking?   acetaminophen (TYLENOL EXTRA STRENGTH) 500 mg tablet   No No   Sig: Take two tablets by mouth every 8 hours as needed. Max of 4,000 mg of acetaminophen in 24 hours.   allopurinol (ZYLOPRIM) 100 mg tablet  Self No No   Sig: Take 1 Tab by mouth daily.   apixaban (ELIQUIS) 5 mg tablet  Self Yes No   Sig: Take 5 mg by mouth twice daily.   cholecalciferol (VITAMIN D-3) 1,000 units tablet  Self Yes No   Sig: Take 2,000 Units by mouth daily.   esomeprazole DR (NEXIUM) 20 mg capsule  Self Yes No   Sig: Take 20 mg by mouth every morning.   folic acid (FOLVITE) 1 mg tablet  Self No No   Sig: Take 1 Tab by mouth daily.   furosemide (LASIX) 40 mg tablet   No No   Sig: Take one tablet by mouth every morning.   gabapentin (NEURONTIN) 400 mg capsule   No No   Sig: Take one capsule by mouth twice daily.   leflunomide (ARAVA) 20 mg tablet   Yes No   Sig: Take 20 mg by mouth daily.   metFORMIN (GLUCOPHAGE) 500 mg tablet  Self No No   Sig: Take 1 Tab by mouth twice daily with meals.   Patient taking differently: Take 500 mg by mouth daily.   metoprolol XL (TOPROL XL) 100 mg extended release tablet  Self No No   Sig: Take one tablet by mouth daily.   nitrofurantoin macrocrystaL (MACRODANTIN) 100 mg capsule   Yes No   Sig: Take 100 mg by mouth every 6 hours. Take with food.   Indications: genitourinary tract infections   simvastatin (ZOCOR) 80 mg tablet   Yes No   Sig: Take  80 mg by mouth at bedtime daily.      Facility-Administered Medications: None       Review of Systems:   CONSTITUTIONAL:  No weight loss, fever, chills, but extreme weakness and fatigue.  HEENT:  Eyes:  No visual loss, blurred vision, double vision or yellow sclerae. Ears, Nose, Throat:  No hearing loss, sneezing, congestion, runny nose or sore throat.  SKIN:  No rash or itching.  CARDIOVASCULAR:  No chest pain, chest pressure or chest discomfort. No palpitations or edema.  RESPIRATORY: No cough or sputum but dyspnea as above.  GASTROINTESTINAL:  No anorexia, nausea, vomiting or diarrhea. No abdominal pain or blood.  GENITOURINARY: Bladder spasms as above.  NEUROLOGICAL:  No headache, dizziness, syncope, paralysis, ataxia, numbness or tingling in the extremities.   MUSCULOSKELETAL: Right leg pain is better at this time.  No back pain per patient.  HEMATOLOGIC:  No anemia, bleeding or bruising.  LYMPHATICS:  No enlarged nodes. No history of splenectomy.  PSYCHIATRIC:  No history of depression or anxiety.  ENDOCRINOLOGIC:  No reports of sweating, cold or heat intolerance. No polyuria or polydipsia.  ALLERGIES:  No history of asthma, hives, eczema or rhinitis.    Physical Exam:  Vital Signs: Last Filed In 24 Hours Vital Signs: 24 Hour Range   BP: 118/93 (04/07 1930)  Temp: 37.5 ?C (99.5 ?F) (04/07 1555)  Pulse: 109 (04/07 1952)  Respirations: 22 PER MINUTE (04/07 1952)  SpO2: 94 % (04/07 1952)  SpO2 Pulse: 108 (04/07 1952) BP: (107-128)/(79-96)   Temp:  [37.5 ?C (99.5 ?F)]   Pulse:  [94-122]   Respirations:  [21 PER MINUTE-23 PER MINUTE]   SpO2:  [79 %-99 %]           General:  Alert, cooperative, in some acute distress, appears stated age  Head:  Normocephalic, atraumatic.   Eyes:  Conjunctivae clear, pupils reactive  Oropharynx: Moist mucus membranes.   Neck:    Supple, moderate JVD  Lungs:  Clear to auscultation bilaterally  Heart:   Regular rate and rhythm, S1, S2 normal, no murmur.  Abdomen:  Soft, non-tender.  Bowel sounds normal. LLQ ostomy noted.   Extremities: Moderate pitting edema  Pulses:  2+ and symmetric, all extremities  Skin: Venous stasis dermatitis  Psychiatric: Normal mood and appropriate affect.     Lab/Radiology/Other Diagnostic Tests:  24-hour labs:    Results for orders placed or performed during the hospital encounter of 03/26/21 (from the past 24 hour(s))   POC CREATININE, RAD    Collection Time: 03/26/21  3:20 PM   Result Value Ref Range    Creatinine, POC 1.3 (H) 0.4 - 1.00 MG/DL   POC LACTATE    Collection Time: 03/26/21  3:20 PM   Result Value Ref Range    LACTIC ACID POC 3.1 (H) 0.5 - 2.0 MMOL/L   POC BLOOD GAS VEN    Collection Time: 03/26/21  3:21 PM   Result Value Ref Range    PH-VEN-POC 7.38 7.30 - 7.40    PCO2-VEN-POC 40 36 - 50 MMHG    PO2-VEN-POC 33 33 - 48 MMHG    Base Def-VEN-POC 2.0 MMOL/L    O2 Sat-VEN-POC 62.0 55 - 71 %    Bicarbonate-VEN-POC 23.5 MMOL/L   POC HEMATOCRIT    Collection Time: 03/26/21  3:21 PM   Result Value Ref Range    Hemoglobin POC 11.2 (L) 12.0 - 15.0 GM/DL    Hematocrit POC 08.6 (L) 36 - 45 %  POC POTASSIUM    Collection Time: 03/26/21  3:21 PM   Result Value Ref Range    Potassium-POC 3.5 3.5 - 5.1 MMOL/L   POC SODIUM    Collection Time: 03/26/21  3:21 PM   Result Value Ref Range    Sodium-POC 137 137 - 147 MMOL/L   POC TROPONIN    Collection Time: 03/26/21  3:22 PM   Result Value Ref Range    Troponin-I-POC 0.37 (H) 0.00 - 0.05 NG/ML   BNP POC ER    Collection Time: 03/26/21  3:22 PM   Result Value Ref Range    BNP POC 1,109.0 (H) 0 - 100 PG/ML   CBC AND DIFF    Collection Time: 03/26/21  3:49 PM   Result Value Ref Range    White Blood Cells 9.2 4.5 - 11.0 K/UL    RBC 3.94 (L) 4.0 - 5.0 M/UL    Hemoglobin 9.9 (L) 12.0 - 15.0 GM/DL    Hematocrit 16.1 (L) 36 - 45 %    MCV 80.5 80 - 100 FL    MCH 25.2 (L) 26 - 34 PG    MCHC 31.3 (L) 32.0 - 36.0 G/DL    RDW 09.6 (H) 11 - 15 %    Platelet Count 349 150 - 400 K/UL    MPV 7.6 7 - 11 FL    Neutrophils 88 (H) 41 - 77 %    Lymphocytes 5 (L) 24 - 44 %    Monocytes 5 4 - 12 %    Eosinophils 1 0 - 5 %    Basophils 1 0 - 2 %    Absolute Neutrophil Count 8.13 (H) 1.8 - 7.0 K/UL    Absolute Lymph Count 0.44 (L) 1.0 - 4.8 K/UL    Absolute Monocyte Count 0.47 0 - 0.80 K/UL    Absolute Eosinophil Count 0.08 0 - 0.45 K/UL    Absolute Basophil Count 0.08 0 - 0.20 K/UL    MDW (Monocyte Distribution Width) 22.8 (H) <20.7   COMPREHENSIVE METABOLIC PANEL    Collection Time: 03/26/21  3:49 PM   Result Value Ref Range    Sodium 136 (L) 137 - 147 MMOL/L    Potassium 3.6 3.5 - 5.1 MMOL/L    Chloride 102 98 - 110 MMOL/L    Glucose 142 (H) 70 - 100 MG/DL    Blood Urea Nitrogen 28 (H) 7 - 25 MG/DL    Creatinine 0.45 (H) 0.4 - 1.00 MG/DL    Calcium 8.6 8.5 - 40.9 MG/DL    Total Protein 6.9 6.0 - 8.0 G/DL    Total Bilirubin 0.7 0.3 - 1.2 MG/DL    Albumin 3.1 (L) 3.5 - 5.0 G/DL    Alk Phosphatase 811 25 - 110 U/L    AST (SGOT) 23 7 - 40 U/L    CO2 20 (L) 21 - 30 MMOL/L    ALT (SGPT) 9 7 - 56 U/L    Anion Gap 14 (H) 3 - 12    eGFR 42 (L) >60 mL/min   TROPONIN-I    Collection Time: 03/26/21  3:49 PM   Result Value Ref Range    Troponin-I 0.27 (H) 0.0 - 0.05 NG/ML   COVID-19 (SARS-COV-2) PCR    Collection Time: 03/26/21  3:49 PM    Specimen: Nasopharyngeal; Flocked Swab   Result Value Ref Range    COVID-19 (SARS-CoV-2) PCR Source FLOCKED SWAB  NASOPHARYNGEAL       COVID-19 (SARS-CoV-2) PCR NOT  DETECTED DN-NOT DETECTED   PROTIME INR (PT)    Collection Time: 03/26/21  3:49 PM   Result Value Ref Range    Protime 23.1 (H) 8.5 - 14.4 SEC    INR 2.0 (H) 0.8 - 1.2   POC TROPONIN    Collection Time: 03/26/21  5:23 PM   Result Value Ref Range    Troponin-I-POC 0.49 (H) 0.00 - 0.05 NG/ML   POC LACTATE    Collection Time: 03/26/21  5:25 PM   Result Value Ref Range    LACTIC ACID POC 1.8 0.5 - 2.0 MMOL/L   URINALYSIS DIPSTICK REFLEX TO CULTURE    Collection Time: 03/26/21  5:39 PM    Specimen: Urine   Result Value Ref Range    Color,UA YELLOW     Turbidity,UA CLEAR CLEAR-CLEAR    Specific Gravity-Urine 1.011 1.005 - 1.030    pH,UA 6.0 5.0 - 8.0    Protein,UA 1+ (A) NEG-NEG    Glucose,UA NEG NEG-NEG    Ketones,UA NEG NEG-NEG    Bilirubin,UA NEG NEG-NEG    Blood,UA NEG NEG-NEG    Urobilinogen,UA NORMAL NORM-NORMAL    Nitrite,UA NEG NEG-NEG    Leukocytes,UA TRACE (A) NEG-NEG    Urine Ascorbic Acid, UA NEG NEG-NEG   URINALYSIS MICROSCOPIC REFLEX TO CULTURE    Collection Time: 03/26/21  5:39 PM    Specimen: Urine   Result Value Ref Range    WBCs,UA 2-10 0 - 2 /HPF    RBCs,UA 0-2 0 - 3 /HPF    Comment,UA       Criteria for reflex to culture are WBC>10, Positive Nitrite, and/or >=+1   leukocytes. If quantity is not sufficient, an addendum will follow.      MucousUA TRACE     Bacteria,UA PACKED (A) NEG-NEG    Squamous Epithelial Cells 2-5 0 - 5   POC TROPONIN Collection Time: 03/26/21  7:56 PM   Result Value Ref Range    Troponin-I-POC 0.56 (H) 0.00 - 0.05 NG/ML   POC GLUCOSE    Collection Time: 03/26/21 10:24 PM   Result Value Ref Range    Glucose, POC 136 (H) 70 - 100 MG/DL     Glucose: (!) 161 (09/60/45 1549)  Pertinent radiology reviewed.    Sylvan Cheese, MD  General Internal Medicine

## 2021-03-27 NOTE — ED Notes
Attempted to call report x 1

## 2021-03-28 ENCOUNTER — Inpatient Hospital Stay: Admit: 2021-03-28 | Discharge: 2021-03-28 | Payer: MEDICARE

## 2021-03-28 MED ADMIN — ACETAMINOPHEN 325 MG PO TAB [101]: 650 mg | ORAL | @ 15:00:00 | NDC 00904677361

## 2021-03-28 MED ADMIN — SODIUM CHLORIDE 0.9 % IV PGBK (MB+) [95161]: 4.5 g | INTRAVENOUS | @ 14:00:00 | NDC 00338055318

## 2021-03-28 MED ADMIN — LINEZOLID IN DEXTROSE 5% 600 MG/300 ML IV PGBK [86283]: 600 mg | INTRAVENOUS | @ 10:00:00 | NDC 55150024251

## 2021-03-28 MED ADMIN — SODIUM CHLORIDE 0.9 % IV SOLP [27838]: 0.6 [IU]/h | INTRAVENOUS | @ 05:00:00 | Stop: 2021-03-28 | NDC 00338004938

## 2021-03-28 MED ADMIN — ATORVASTATIN 40 MG PO TAB [77113]: 40 mg | ORAL | @ 14:00:00 | NDC 00904629261

## 2021-03-28 MED ADMIN — CYANOCOBALAMIN (VITAMIN B-12) 500 MCG PO TAB [14673]: 500 ug | ORAL | @ 14:00:00 | NDC 50268085411

## 2021-03-28 MED ADMIN — SODIUM CHLORIDE 0.9 % IV SOLP [27838]: 25 mL | INTRAVENOUS | @ 11:00:00 | Stop: 2021-03-28 | NDC 00338004902

## 2021-03-28 MED ADMIN — BUMETANIDE 0.25 MG/ML IJ SOLN [9308]: 6 mg | INTRAVENOUS | @ 01:00:00 | Stop: 2021-03-28 | NDC 00409141240

## 2021-03-28 MED ADMIN — POTASSIUM CHLORIDE 20 MEQ PO TBTQ [35943]: 60 meq | ORAL | @ 11:00:00 | Stop: 2021-03-28 | NDC 00904708661

## 2021-03-28 MED ADMIN — SODIUM CHLORIDE 0.9 % IV PGBK (MB+) [95161]: 4.5 g | INTRAVENOUS | @ 20:00:00 | NDC 00338055318

## 2021-03-28 MED ADMIN — PIPERACILLIN-TAZOBACTAM 4.5 GRAM IV SOLR [80419]: 4.5 g | INTRAVENOUS | @ 20:00:00 | NDC 60505615900

## 2021-03-28 MED ADMIN — ACETAMINOPHEN 325 MG PO TAB [101]: 650 mg | ORAL | @ 03:00:00 | NDC 00904677361

## 2021-03-28 MED ADMIN — POTASSIUM CHLORIDE IN WATER 10 MEQ/50 ML IV PGBK [11075]: 10 meq | INTRAVENOUS | @ 13:00:00 | Stop: 2021-03-28 | NDC 00338070541

## 2021-03-28 MED ADMIN — POTASSIUM CHLORIDE 20 MEQ PO TBTQ [35943]: 60 meq | ORAL | @ 20:00:00 | Stop: 2021-03-28 | NDC 00904708661

## 2021-03-28 MED ADMIN — NOREPINEPHRINE BITARTRATE-NACL 4 MG/250 ML (16 MCG/ML) IV SOLN [173409]: 0.02 ug/kg/min | INTRAVENOUS | @ 02:00:00 | NDC 70004077140

## 2021-03-28 MED ADMIN — VASOPRESSIN 20 UNIT/ML IV SOLN [323489]: 0.6 [IU]/h | INTRAVENOUS | @ 05:00:00 | Stop: 2021-03-28 | NDC 42367057022

## 2021-03-28 MED ADMIN — SODIUM CHLORIDE 0.9 % IV PGBK (MB+) [95161]: 4.5 g | INTRAVENOUS | @ 02:00:00 | NDC 00338055318

## 2021-03-28 MED ADMIN — BUMETANIDE 0.25 MG/ML IJ SOLN [9308]: 2 mg/h | INTRAVENOUS | @ 07:00:00 | Stop: 2021-03-28 | NDC 00641600701

## 2021-03-28 MED ADMIN — PIPERACILLIN-TAZOBACTAM 4.5 GRAM IV SOLR [80419]: 4.5 g | INTRAVENOUS | @ 14:00:00 | NDC 00409339011

## 2021-03-28 MED ADMIN — ASPIRIN 81 MG PO TBEC [14113]: 81 mg | ORAL | @ 14:00:00 | NDC 00904675180

## 2021-03-28 MED ADMIN — LINEZOLID IN DEXTROSE 5% 600 MG/300 ML IV PGBK [86283]: 600 mg | INTRAVENOUS | @ 22:00:00 | NDC 55150024251

## 2021-03-28 MED ADMIN — PIPERACILLIN-TAZOBACTAM 4.5 GRAM IV SOLR [80419]: 4.5 g | INTRAVENOUS | @ 07:00:00 | NDC 00409339011

## 2021-03-28 MED ADMIN — SODIUM CHLORIDE 0.9 % IV PGBK (MB+) [95161]: 4.5 g | INTRAVENOUS | @ 07:00:00 | NDC 00338055318

## 2021-03-28 MED ADMIN — BUMETANIDE 0.25 MG/ML IJ SOLN [9308]: 2 mg/h | INTRAVENOUS | @ 02:00:00 | NDC 00641600701

## 2021-03-28 MED ADMIN — PIPERACILLIN-TAZOBACTAM 4.5 GRAM IV SOLR [80419]: 4.5 g | INTRAVENOUS | @ 02:00:00 | NDC 00409339011

## 2021-03-28 MED ADMIN — DEXTROSE 5% IN WATER IV SOLP [2364]: 10 mg/h | INTRAVENOUS | @ 15:00:00 | Stop: 2021-03-28 | NDC 00338001738

## 2021-03-28 MED ADMIN — FUROSEMIDE 10 MG/ML IJ SOLN [3291]: 10 mg/h | INTRAVENOUS | @ 15:00:00 | Stop: 2021-03-28 | NDC 70121107601

## 2021-03-28 MED ADMIN — CHLOROTHIAZIDE SODIUM 500 MG IV SOLR [81318]: 1000 mg | INTRAVENOUS | @ 01:00:00 | Stop: 2021-03-28 | NDC 17478041940

## 2021-03-28 MED ADMIN — POTASSIUM CHLORIDE IN WATER 10 MEQ/50 ML IV PGBK [11075]: 10 meq | INTRAVENOUS | @ 11:00:00 | Stop: 2021-03-28 | NDC 00338070541

## 2021-03-29 MED ADMIN — POTASSIUM CHLORIDE 20 MEQ PO TBTQ [35943]: 60 meq | ORAL | @ 04:00:00 | Stop: 2021-03-29 | NDC 00245531989

## 2021-03-29 MED ADMIN — DAPTOMYCIN SYR [212028]: 900 mg | INTRAVENOUS | @ 23:00:00 | NDC 63323087115

## 2021-03-29 MED ADMIN — FUROSEMIDE 10 MG/ML IJ SOLN [3291]: 40 mg | INTRAVENOUS | @ 15:00:00 | NDC 70860030242

## 2021-03-29 MED ADMIN — DEXTROSE 5% IN WATER IV SOLP [2364]: 100 mL | INTRAVENOUS | @ 18:00:00 | Stop: 2021-03-29 | NDC 00338001748

## 2021-03-29 MED ADMIN — DEXTROSE 5% INJ BAG (NON-DEHP/PVC) [210822]: 1 mg/min | INTRAVENOUS | @ 18:00:00 | NDC 00338634602

## 2021-03-29 MED ADMIN — AMIODARONE 50 MG/ML IV SOLN [9065]: 150 mg | INTRAVENOUS | @ 18:00:00 | Stop: 2021-03-29 | NDC 00143987501

## 2021-03-29 MED ADMIN — ACETAMINOPHEN 325 MG PO TAB [101]: 650 mg | ORAL | @ 18:00:00 | NDC 00904677361

## 2021-03-29 MED ADMIN — SODIUM CHLORIDE 0.9 % IV PGBK (MB+) [95161]: 4.5 g | INTRAVENOUS | @ 16:00:00 | NDC 00338055318

## 2021-03-29 MED ADMIN — SODIUM CHLORIDE 0.9 % IV PGBK (MB+) [95161]: 4.5 g | INTRAVENOUS | @ 21:00:00 | NDC 00338055318

## 2021-03-29 MED ADMIN — ASPIRIN 81 MG PO TBEC [14113]: 81 mg | ORAL | @ 14:00:00 | NDC 00904675180

## 2021-03-29 MED ADMIN — PIPERACILLIN-TAZOBACTAM 4.5 GRAM IV SOLR [80419]: 4.5 g | INTRAVENOUS | @ 02:00:00 | NDC 70594008001

## 2021-03-29 MED ADMIN — CYANOCOBALAMIN (VITAMIN B-12) 500 MCG PO TAB [14673]: 500 ug | ORAL | @ 15:00:00 | NDC 50268085411

## 2021-03-29 MED ADMIN — AMIODARONE 200 MG PO TAB [9066]: 400 mg | ORAL | @ 15:00:00 | Stop: 2021-03-29 | NDC 00904699361

## 2021-03-29 MED ADMIN — ATORVASTATIN 40 MG PO TAB [77113]: 40 mg | ORAL | @ 14:00:00 | NDC 00904629261

## 2021-03-29 MED ADMIN — LINEZOLID IN DEXTROSE 5% 600 MG/300 ML IV PGBK [86283]: 600 mg | INTRAVENOUS | @ 21:00:00 | Stop: 2021-03-29 | NDC 55150024251

## 2021-03-29 MED ADMIN — PIPERACILLIN-TAZOBACTAM 4.5 GRAM IV SOLR [80419]: 4.5 g | INTRAVENOUS | @ 08:00:00 | NDC 64679001202

## 2021-03-29 MED ADMIN — NOREPINEPHRINE BITARTRATE-NACL 4 MG/250 ML (16 MCG/ML) IV SOLN [173409]: 0.02 ug/kg/min | INTRAVENOUS | @ 05:00:00 | NDC 70004077140

## 2021-03-29 MED ADMIN — FUROSEMIDE 10 MG/ML IJ SOLN [3291]: 40 mg | INTRAVENOUS | @ 21:00:00 | NDC 70860030242

## 2021-03-29 MED ADMIN — MAGNESIUM SULFATE IN D5W 1 GRAM/100 ML IV PGBK [166578]: 1 g | INTRAVENOUS | @ 05:00:00 | Stop: 2021-03-29 | NDC 00338170940

## 2021-03-29 MED ADMIN — MAGNESIUM SULFATE IN D5W 1 GRAM/100 ML IV PGBK [166578]: 1 g | INTRAVENOUS | @ 04:00:00 | Stop: 2021-03-29 | NDC 00338170940

## 2021-03-29 MED ADMIN — LINEZOLID IN DEXTROSE 5% 600 MG/300 ML IV PGBK [86283]: 600 mg | INTRAVENOUS | @ 10:00:00 | Stop: 2021-03-29 | NDC 55150024251

## 2021-03-29 MED ADMIN — LIDOCAINE 5 % TP PTMD [80759]: 2 | TOPICAL | @ 14:00:00 | NDC 00591352530

## 2021-03-29 MED ADMIN — SODIUM CHLORIDE 0.9 % IV PGBK (MB+) [95161]: 4.5 g | INTRAVENOUS | @ 02:00:00 | NDC 00338055318

## 2021-03-29 MED ADMIN — AMIODARONE 50 MG/ML IV SOLN [9065]: 1 mg/min | INTRAVENOUS | @ 18:00:00 | NDC 63323061602

## 2021-03-29 MED ADMIN — SODIUM CHLORIDE 0.9 % IV PGBK (MB+) [95161]: 4.5 g | INTRAVENOUS | @ 08:00:00 | NDC 00338055318

## 2021-03-29 MED ADMIN — PIPERACILLIN-TAZOBACTAM 4.5 GRAM IV SOLR [80419]: 4.5 g | INTRAVENOUS | @ 21:00:00 | NDC 64679001202

## 2021-03-29 MED ADMIN — PIPERACILLIN-TAZOBACTAM 4.5 GRAM IV SOLR [80419]: 4.5 g | INTRAVENOUS | @ 16:00:00 | NDC 64679001202

## 2021-03-29 MED ADMIN — ACETAMINOPHEN 325 MG PO TAB [101]: 650 mg | ORAL | @ 02:00:00 | NDC 00904677361

## 2021-03-30 ENCOUNTER — Inpatient Hospital Stay: Admit: 2021-03-30 | Discharge: 2021-03-30 | Payer: MEDICARE

## 2021-03-30 MED ORDER — PROPOFOL 10 MG/ML IV EMUL 20 ML (INFUSION)(AM)(OR)
INTRAVENOUS | 0 refills | Status: DC
Start: 2021-03-30 — End: 2021-03-30
  Administered 2021-03-30: 19:00:00 60 ug/kg/min via INTRAVENOUS

## 2021-03-30 MED ORDER — LIDOCAINE (PF) 20 MG/ML (2 %) IJ SOLN
INTRAVENOUS | 0 refills | Status: DC
Start: 2021-03-30 — End: 2021-03-30
  Administered 2021-03-30: 19:00:00 40 mg via INTRAVENOUS

## 2021-03-30 MED ADMIN — PIPERACILLIN-TAZOBACTAM 4.5 GRAM IV SOLR [80419]: 4.5 g | INTRAVENOUS | @ 02:00:00 | NDC 70594008001

## 2021-03-30 MED ADMIN — AMIODARONE 50 MG/ML IV SOLN [9065]: 1 mg/min | INTRAVENOUS | @ 04:00:00 | NDC 67457015318

## 2021-03-30 MED ADMIN — PIPERACILLIN-TAZOBACTAM 4.5 GRAM IV SOLR [80419]: 4.5 g | INTRAVENOUS | @ 22:00:00 | NDC 60505615900

## 2021-03-30 MED ADMIN — POTASSIUM CHLORIDE 20 MEQ PO TBTQ [35943]: 40 meq | ORAL | @ 02:00:00 | Stop: 2021-03-30 | NDC 00245531989

## 2021-03-30 MED ADMIN — ATORVASTATIN 40 MG PO TAB [77113]: 40 mg | ORAL | @ 15:00:00 | NDC 00904629261

## 2021-03-30 MED ADMIN — LIDOCAINE 5 % TP PTMD [80759]: 2 | TOPICAL | @ 15:00:00 | NDC 00591352530

## 2021-03-30 MED ADMIN — PIPERACILLIN-TAZOBACTAM 4.5 GRAM IV SOLR [80419]: 4.5 g | INTRAVENOUS | @ 18:00:00 | NDC 60505615900

## 2021-03-30 MED ADMIN — CYANOCOBALAMIN (VITAMIN B-12) 500 MCG PO TAB [14673]: 500 ug | ORAL | @ 15:00:00 | NDC 50268085411

## 2021-03-30 MED ADMIN — DEXTROSE 5% INJ BAG (NON-DEHP/PVC) [210822]: 1 mg/min | INTRAVENOUS | @ 21:00:00 | NDC 00264751020

## 2021-03-30 MED ADMIN — SODIUM CHLORIDE 0.9 % IV PGBK (MB+) [95161]: 4.5 g | INTRAVENOUS | @ 18:00:00 | NDC 00338055318

## 2021-03-30 MED ADMIN — FUROSEMIDE 10 MG/ML IJ SOLN [3291]: 40 mg | INTRAVENOUS | @ 22:00:00 | NDC 70860030242

## 2021-03-30 MED ADMIN — SODIUM CHLORIDE 0.9 % IV PGBK (MB+) [95161]: 4.5 g | INTRAVENOUS | @ 02:00:00 | NDC 00338055318

## 2021-03-30 MED ADMIN — SODIUM CHLORIDE 0.9 % IV PGBK (MB+) [95161]: 4.5 g | INTRAVENOUS | @ 22:00:00 | NDC 00338055318

## 2021-03-30 MED ADMIN — NOREPINEPHRINE BITARTRATE-NACL 4 MG/250 ML (16 MCG/ML) IV SOLN [173409]: 0.01 ug/kg/min | INTRAVENOUS | @ 19:00:00 | NDC 70004077140

## 2021-03-30 MED ADMIN — ACETAMINOPHEN 325 MG PO TAB [101]: 650 mg | ORAL | @ 07:00:00 | NDC 00904677361

## 2021-03-30 MED ADMIN — SODIUM CHLORIDE 0.9 % IV PGBK (MB+) [95161]: 4.5 g | INTRAVENOUS | @ 08:00:00 | NDC 00338055318

## 2021-03-30 MED ADMIN — FUROSEMIDE 10 MG/ML IJ SOLN [3291]: 40 mg | INTRAVENOUS | @ 15:00:00 | NDC 36000028325

## 2021-03-30 MED ADMIN — AMIODARONE 50 MG/ML IV SOLN [9065]: 1 mg/min | INTRAVENOUS | @ 12:00:00 | NDC 00143987501

## 2021-03-30 MED ADMIN — PIPERACILLIN-TAZOBACTAM 4.5 GRAM IV SOLR [80419]: 4.5 g | INTRAVENOUS | @ 08:00:00 | NDC 64679001202

## 2021-03-30 MED ADMIN — MAGNESIUM SULFATE IN D5W 1 GRAM/100 ML IV PGBK [166578]: 1 g | INTRAVENOUS | @ 02:00:00 | Stop: 2021-03-30 | NDC 00338170940

## 2021-03-30 MED ADMIN — DEXTROSE 5% IN WATER IV SOLP [2364]: 1400 [IU]/h | INTRAVENOUS | @ 18:00:00 | NDC 00338001702

## 2021-03-30 MED ADMIN — DAPTOMYCIN SYR [212028]: 900 mg | INTRAVENOUS | @ 23:00:00 | NDC 63323087115

## 2021-03-30 MED ADMIN — DEXTROSE 5% INJ BAG (NON-DEHP/PVC) [210822]: 1 mg/min | INTRAVENOUS | @ 12:00:00 | NDC 00264751020

## 2021-03-30 MED ADMIN — HEPARIN (PORCINE) 10,000 UNIT/ML IJ SOLN [10177]: 1400 [IU]/h | INTRAVENOUS | @ 18:00:00 | NDC 00409272130

## 2021-03-30 MED ADMIN — DEXTROSE 5% INJ BAG (NON-DEHP/PVC) [210822]: 1 mg/min | INTRAVENOUS | @ 04:00:00 | NDC 00264751020

## 2021-03-30 MED ADMIN — ASPIRIN 81 MG PO TBEC [14113]: 81 mg | ORAL | @ 15:00:00 | NDC 00904675180

## 2021-03-30 MED ADMIN — FUROSEMIDE 10 MG/ML IJ SOLN [3291]: 40 mg | INTRAVENOUS | @ 04:00:00 | Stop: 2021-03-30 | NDC 70860030242

## 2021-03-30 MED ADMIN — AMIODARONE 50 MG/ML IV SOLN [9065]: 1 mg/min | INTRAVENOUS | @ 21:00:00 | NDC 63323061602

## 2021-03-30 NOTE — Anesthesia Post-Procedure Evaluation
Post-Anesthesia Evaluation    Name: Madison Johnston      MRN: [id number]     DOB: August 08, 1946     Age: 75 y.o.     Sex: female   __________________________________________________________________________     Procedure Information     Anesthesia Start Date/Time: 03/30/21 1417    Scheduled providers: Claude Manges, MD    Procedure: TRANSESOPHAGEAL ECHO    Location: Cardiology:[facility]          Post-Anesthesia Vitals  Temp: 36.4 C (97.6 F) (04/11 1600)  Pulse: 100 (04/11 1545)  Respirations: 16 PER MINUTE (04/11 1545)  SpO2: 100 % (04/11 1545)   Vitals Value Taken Time   BP     Temp     Pulse 96 03/30/21 1626   Respirations 20 PER MINUTE 03/30/21 1626   SpO2 100 % 03/30/21 1626   O2 Device     ABP     ART BP     Vitals shown include unvalidated device data.      Post Anesthesia Evaluation Note    Evaluation location: ICU  Patient participation: recovered; patient participated in evaluation  Level of consciousness: alert    Pain score: 0  Pain management: adequate    Hydration: normovolemia  Temperature: 36.0C - 38.4C  Airway patency: adequate    Perioperative Events       Post-op nausea and vomiting: no PONV    Postoperative Status  Cardiovascular status: Cardiovascular status: Stable on same drips as preop.  Respiratory status: spontaneous ventilation  Follow-up needed: none  ICU Information  VasoactiveDrips:norepinephrine infusion and vasopressin    Cardiac ICU information      cardiac index monitored          Perioperative Events  There were no known complications for this encounter.

## 2021-03-31 ENCOUNTER — Inpatient Hospital Stay: Admit: 2021-03-31 | Discharge: 2021-03-31 | Payer: MEDICARE

## 2021-03-31 ENCOUNTER — Encounter: Admit: 2021-03-31 | Discharge: 2021-03-31 | Payer: MEDICARE

## 2021-03-31 MED ORDER — MIDAZOLAM 1 MG/ML IJ SOLN
INTRAVENOUS | 0 refills | Status: CP
Start: 2021-03-31 — End: ?
  Administered 2021-03-31: 21:00:00 4 mg via INTRAVENOUS

## 2021-03-31 MED ORDER — ETOMIDATE 2 MG/ML IV SOLN
INTRAVENOUS | 0 refills | Status: CP
Start: 2021-03-31 — End: ?
  Administered 2021-03-31: 21:00:00 22 mg via INTRAVENOUS

## 2021-03-31 MED ORDER — SUCCINYLCHOLINE CHLORIDE 20 MG/ML IJ SOLN
INTRAVENOUS | 0 refills | Status: CP
Start: 2021-03-31 — End: ?
  Administered 2021-03-31: 21:00:00 140 mg via INTRAVENOUS

## 2021-03-31 MED ADMIN — POTASSIUM CHLORIDE 20 MEQ PO TBTQ [35943]: 20 meq | ORAL | @ 11:00:00 | Stop: 2021-03-31 | NDC 00904708661

## 2021-03-31 MED ADMIN — ASPIRIN 81 MG PO TBEC [14113]: 81 mg | ORAL | @ 14:00:00 | Stop: 2021-03-31 | NDC 00904675180

## 2021-03-31 MED ADMIN — DEXMEDETOMIDINE IN 0.9 % NACL 400 MCG/100 ML (4 MCG/ML) IV SOLN [317250]: 0.5 ug/kg/h | INTRAVENOUS | @ 23:00:00 | NDC 70121138901

## 2021-03-31 MED ADMIN — DEXTROSE 5% IN WATER IV SOLP [2364]: 1400 [IU]/h | INTRAVENOUS | @ 09:00:00 | Stop: 2021-03-31 | NDC 00338001702

## 2021-03-31 MED ADMIN — PIPERACILLIN-TAZOBACTAM 4.5 GRAM IV SOLR [80419]: 4.5 g | INTRAVENOUS | @ 20:00:00 | NDC 60505615900

## 2021-03-31 MED ADMIN — CYANOCOBALAMIN (VITAMIN B-12) 500 MCG PO TAB [14673]: 500 ug | ORAL | @ 15:00:00 | NDC 50268085411

## 2021-03-31 MED ADMIN — ASPIRIN 81 MG PO TBEC [14113]: 243 mg | ORAL | @ 16:00:00 | Stop: 2021-03-31 | NDC 00904675180

## 2021-03-31 MED ADMIN — SODIUM CHLORIDE 0.9 % IV PGBK (MB+) [95161]: 4.5 g | INTRAVENOUS | @ 16:00:00 | NDC 00338055318

## 2021-03-31 MED ADMIN — AMIODARONE 50 MG/ML IV SOLN [9065]: 1 mg/min | INTRAVENOUS | @ 05:00:00 | Stop: 2021-03-31 | NDC 63323061603

## 2021-03-31 MED ADMIN — SODIUM CHLORIDE 0.9 % IV PGBK (MB+) [95161]: 4.5 g | INTRAVENOUS | @ 20:00:00 | NDC 00338055318

## 2021-03-31 MED ADMIN — HEPARIN (PORCINE) 10,000 UNIT/ML IJ SOLN [10177]: 1400 [IU]/h | INTRAVENOUS | @ 09:00:00 | Stop: 2021-03-31 | NDC 63323054209

## 2021-03-31 MED ADMIN — FUROSEMIDE 10 MG/ML IJ SOLN [3291]: 40 mg | INTRAVENOUS | @ 07:00:00 | NDC 70860030242

## 2021-03-31 MED ADMIN — FENTANYL DRIP IN NS 1000MCG/100ML [30807]: 35 ug/h | INTRAVENOUS | @ 23:00:00 | NDC 70004020232

## 2021-03-31 MED ADMIN — SODIUM CHLORIDE 0.9 % IV PGBK (MB+) [95161]: 4.5 g | INTRAVENOUS | @ 09:00:00 | NDC 00338055318

## 2021-03-31 MED ADMIN — ACETAMINOPHEN 325 MG PO TAB [101]: 650 mg | ORAL | @ 11:00:00 | NDC 00904677361

## 2021-03-31 MED ADMIN — PIPERACILLIN-TAZOBACTAM 4.5 GRAM IV SOLR [80419]: 4.5 g | INTRAVENOUS | @ 02:00:00 | NDC 65219025905

## 2021-03-31 MED ADMIN — PIPERACILLIN-TAZOBACTAM 4.5 GRAM IV SOLR [80419]: 4.5 g | INTRAVENOUS | @ 09:00:00 | NDC 70594008001

## 2021-03-31 MED ADMIN — SODIUM CHLORIDE 0.9 % IV PGBK (MB+) [95161]: 4.5 g | INTRAVENOUS | @ 02:00:00 | NDC 00338055318

## 2021-03-31 MED ADMIN — AMIODARONE 200 MG PO TAB [9066]: 400 mg | ORAL | @ 15:00:00 | NDC 00904699361

## 2021-03-31 MED ADMIN — DEXTROSE 5% INJ BAG (NON-DEHP/PVC) [210822]: 1 mg/min | INTRAVENOUS | @ 05:00:00 | Stop: 2021-03-31 | NDC 00264751020

## 2021-03-31 MED ADMIN — FUROSEMIDE 10 MG/ML IJ SOLN [3291]: 40 mg | INTRAVENOUS | @ 14:00:00 | NDC 70860030242

## 2021-03-31 MED ADMIN — ATORVASTATIN 40 MG PO TAB [77113]: 40 mg | ORAL | @ 14:00:00 | NDC 00904629261

## 2021-03-31 MED ADMIN — PIPERACILLIN-TAZOBACTAM 4.5 GRAM IV SOLR [80419]: 4.5 g | INTRAVENOUS | @ 16:00:00 | NDC 60505615900

## 2021-03-31 NOTE — Anesthesia Procedure Notes
Procedure: Airway Placement    AIRWAY INSERTION    Date/Time: 03/31/2021 3:45 PM    Patient location: OR  Urgency: emergent  Difficult Airway: No        The procedure was performed in an emergent situation.  Verbal consent not obtained.  Written consent not obtained.  Risks and benefits discussed: no    Airway Procedure  Indication(s) for airway management: airway protection        rapid sequence induction  Level of sedation achieved: Unconscious, no arousal with painful stimuli  Preoxygenated: yes  Patient position: sniffing  Neck stabilization: no in-line stabilization    Mask difficulty assessment: 0 - not attempted    Procedure Medications  Sedation: midazolam (VERSED) 1 mg/mL injection, 4 mg  Airway Anesthesia: etomidate (AMIDATE) injection), 22 mg  Muscle Relaxants: succinylcholine (ANECTINE) injection, 140 mg    Procedure Outcome  Final airway type: endotracheal airway  Endotracheal airway: ETT          ETT size (mm): 8.0;   Technique used for successful ETT placement: direct laryngoscopy  Devices/methods used in placement: intubating stylet  Insertion site: oral  Blade type: Macintosh;   Laryngoscope/Videolaryngoscope blade size: 3  Cormack-Lehane classification: grade IIa - partial view of glottis      Measured from: lips (22cm)  Number of attempts at approach: 1  Placement verified by bronchoscopy, capnometry and colorimetric CO2 detector            Complications  Cardiovascular:  Pulmonary:  Procedure:airway not difficult,  Medication:  Additional notes: Emergent intubation code for hemoptysis suspected secondary to pulmonary artery perforation with cardiac catheterization    Patient intubated in cath lab by anesthesiology team    Following intubation, fiberoptic bronchoscopy performed by anesthesiology team noting appropriate placement of ETT; blood in airway but no active bleeding suspected;. A separate subsequent bronchoscopy was performed by CTS, please see separate note for procedural findings.    After patient was stabilized and no further care by anesthesiology team was requested, care was turned over to cath lab team      Performed by: Aggie Hacker., MD  Authorized by: Theodis Aguas, MD    Refer to nursing documentation for vitals/monitoring data

## 2021-04-01 ENCOUNTER — Inpatient Hospital Stay: Admit: 2021-04-01 | Discharge: 2021-04-01 | Payer: MEDICARE

## 2021-04-01 MED ADMIN — FUROSEMIDE 10 MG/ML IJ SOLN [3291]: 40 mg | INTRAVENOUS | @ 08:00:00 | Stop: 2021-04-01 | NDC 70860030242

## 2021-04-01 MED ADMIN — POTASSIUM CHLORIDE 20 MEQ/15 ML PO LIQD [6432]: 40 meq | OROGASTRIC | @ 10:00:00 | Stop: 2021-04-01 | NDC 00121168015

## 2021-04-01 MED ADMIN — FUROSEMIDE 10 MG/ML IJ SOLN [3291]: 40 mg | INTRAVENOUS | @ 13:00:00 | Stop: 2021-04-01 | NDC 70860030242

## 2021-04-01 MED ADMIN — SODIUM CHLORIDE 0.9 % IV PGBK (MB+) [95161]: 4.5 g | INTRAVENOUS | @ 02:00:00 | NDC 00338055318

## 2021-04-01 MED ADMIN — MAGNESIUM SULFATE IN D5W 1 GRAM/100 ML IV PGBK [166578]: 1 g | INTRAVENOUS | @ 05:00:00 | Stop: 2021-04-02 | NDC 00338170940

## 2021-04-01 MED ADMIN — PIPERACILLIN-TAZOBACTAM 4.5 GRAM IV SOLR [80419]: 4.5 g | INTRAVENOUS | @ 02:00:00 | NDC 60505615900

## 2021-04-01 MED ADMIN — SODIUM CHLORIDE 0.9 % IV PGBK (MB+) [95161]: 4.5 g | INTRAVENOUS | @ 08:00:00 | Stop: 2021-04-01 | NDC 00338915930

## 2021-04-01 MED ADMIN — DEXMEDETOMIDINE IN 0.9 % NACL 400 MCG/100 ML (4 MCG/ML) IV SOLN [317250]: 1 ug/kg/h | INTRAVENOUS | @ 11:00:00 | Stop: 2021-04-01 | NDC 67457092500

## 2021-04-01 MED ADMIN — CHLORHEXIDINE GLUCONATE 0.12 % MM MWSH [9516]: 15 mL | ORAL | @ 13:00:00 | Stop: 2021-04-01 | NDC 69339013815

## 2021-04-01 MED ADMIN — DEXMEDETOMIDINE IN 0.9 % NACL 400 MCG/100 ML (4 MCG/ML) IV SOLN [317250]: 0.5 ug/kg/h | INTRAVENOUS | @ 05:00:00 | NDC 70121138901

## 2021-04-01 MED ADMIN — AMIODARONE 200 MG PO TAB [9066]: 400 mg | ORAL | @ 02:00:00 | NDC 00904699361

## 2021-04-01 MED ADMIN — LIDOCAINE 5 % TP PTMD [80759]: 2 | TOPICAL | @ 13:00:00 | NDC 00591352530

## 2021-04-01 MED ADMIN — NOREPINEPHRINE BITARTRATE-NACL 4 MG/250 ML (16 MCG/ML) IV SOLN [173409]: 0.03 ug/kg/min | INTRAVENOUS | @ 11:00:00 | NDC 70004077140

## 2021-04-01 MED ADMIN — AMIODARONE 200 MG PO TAB [9066]: 400 mg | ORAL | @ 13:00:00 | Stop: 2021-04-02 | NDC 00904699361

## 2021-04-01 MED ADMIN — PIPERACILLIN-TAZOBACTAM 4.5 GRAM IV SOLR [80419]: 4.5 g | INTRAVENOUS | @ 21:00:00 | Stop: 2021-04-04 | NDC 60505615900

## 2021-04-01 MED ADMIN — CYANOCOBALAMIN (VITAMIN B-12) 500 MCG PO TAB [14673]: 500 ug | ORAL | @ 13:00:00 | NDC 50268085411

## 2021-04-01 MED ADMIN — PIPERACILLIN-TAZOBACTAM 4.5 GRAM IV SOLR [80419]: 4.5 g | INTRAVENOUS | @ 08:00:00 | Stop: 2021-04-01 | NDC 60505615900

## 2021-04-01 MED ADMIN — SODIUM CHLORIDE 0.9 % IV PGBK (MB+) [95161]: 4.5 g | INTRAVENOUS | @ 21:00:00 | Stop: 2021-04-04 | NDC 00338055318

## 2021-04-01 MED ADMIN — SODIUM CHLORIDE 0.9 % IV PGBK (MB+) [95161]: 4.5 g | INTRAVENOUS | @ 13:00:00 | Stop: 2021-04-01 | NDC 00338915930

## 2021-04-01 MED ADMIN — PIPERACILLIN-TAZOBACTAM 4.5 GRAM IV SOLR [80419]: 4.5 g | INTRAVENOUS | @ 13:00:00 | Stop: 2021-04-01 | NDC 60505615900

## 2021-04-01 MED ADMIN — ATORVASTATIN 40 MG PO TAB [77113]: 40 mg | ORAL | @ 13:00:00 | NDC 00904629261

## 2021-04-02 ENCOUNTER — Inpatient Hospital Stay: Admit: 2021-04-02 | Discharge: 2021-04-02 | Payer: MEDICARE

## 2021-04-02 MED ADMIN — PIPERACILLIN-TAZOBACTAM 4.5 GRAM IV SOLR [80419]: 4.5 g | INTRAVENOUS | @ 02:00:00 | Stop: 2021-04-04 | NDC 60505615900

## 2021-04-02 MED ADMIN — AMIODARONE 50 MG/ML IV SOLN [9065]: 0.5 mg/min | INTRAVENOUS | @ 01:00:00 | NDC 00143987501

## 2021-04-02 MED ADMIN — SODIUM CHLORIDE 0.9 % IV PGBK (MB+) [95161]: 4.5 g | INTRAVENOUS | @ 20:00:00 | Stop: 2021-04-04 | NDC 00338055318

## 2021-04-02 MED ADMIN — SODIUM CHLORIDE 0.9 % IV PGBK (MB+) [95161]: 4.5 g | INTRAVENOUS | @ 14:00:00 | Stop: 2021-04-04 | NDC 00338055318

## 2021-04-02 MED ADMIN — PIPERACILLIN-TAZOBACTAM 4.5 GRAM IV SOLR [80419]: 4.5 g | INTRAVENOUS | @ 08:00:00 | Stop: 2021-04-04 | NDC 60505615900

## 2021-04-02 MED ADMIN — CALCIUM GLUC IN NACL, ISO-OSM 1 GRAM/100 ML IV SOLN [457594]: 1 g | INTRAVENOUS | @ 14:00:00 | Stop: 2021-04-02 | NDC 44567062201

## 2021-04-02 MED ADMIN — POTASSIUM CHLORIDE IN WATER 10 MEQ/50 ML IV PGBK [11075]: 10 meq | INTRAVENOUS | @ 14:00:00 | Stop: 2021-04-02 | NDC 00338070541

## 2021-04-02 MED ADMIN — PANTOPRAZOLE 40 MG IV SOLR [78621]: 40 mg | INTRAVENOUS | @ 01:00:00 | NDC 55150020200

## 2021-04-02 MED ADMIN — CHLOROTHIAZIDE SODIUM 500 MG IV SOLR [81318]: 500 mg | INTRAVENOUS | @ 21:00:00 | Stop: 2021-04-02 | NDC 17478041940

## 2021-04-02 MED ADMIN — FUROSEMIDE 10 MG/ML IJ SOLN [3291]: 40 mg | INTRAVENOUS | @ 01:00:00 | NDC 70860030242

## 2021-04-02 MED ADMIN — CYANOCOBALAMIN (VITAMIN B-12) 500 MCG PO TAB [14673]: 500 ug | ORAL | @ 16:00:00 | NDC 50268085411

## 2021-04-02 MED ADMIN — DICLOFENAC SODIUM 1 % TP GEL [168032]: 2 g | TOPICAL | @ 04:00:00 | NDC 65162083366

## 2021-04-02 MED ADMIN — POTASSIUM CHLORIDE IN WATER 10 MEQ/50 ML IV PGBK [11075]: 10 meq | INTRAVENOUS | @ 11:00:00 | Stop: 2021-04-02 | NDC 00338070541

## 2021-04-02 MED ADMIN — DEXTROSE 5% INJ BAG (NON-DEHP/PVC) [210822]: 0.5 mg/min | INTRAVENOUS | @ 01:00:00 | NDC 00264751020

## 2021-04-02 MED ADMIN — PIPERACILLIN-TAZOBACTAM 4.5 GRAM IV SOLR [80419]: 4.5 g | INTRAVENOUS | @ 14:00:00 | Stop: 2021-04-04 | NDC 60505615900

## 2021-04-02 MED ADMIN — ATORVASTATIN 40 MG PO TAB [77113]: 40 mg | ORAL | @ 16:00:00 | NDC 00904629261

## 2021-04-02 MED ADMIN — SODIUM CHLORIDE 0.9 % IV PGBK (MB+) [95161]: 4.5 g | INTRAVENOUS | @ 08:00:00 | Stop: 2021-04-04 | NDC 00338915930

## 2021-04-02 MED ADMIN — POTASSIUM CHLORIDE IN WATER 10 MEQ/50 ML IV PGBK [11075]: 10 meq | INTRAVENOUS | @ 12:00:00 | Stop: 2021-04-02 | NDC 00338070541

## 2021-04-02 MED ADMIN — PIPERACILLIN-TAZOBACTAM 4.5 GRAM IV SOLR [80419]: 4.5 g | INTRAVENOUS | @ 20:00:00 | Stop: 2021-04-04 | NDC 60505615900

## 2021-04-02 MED ADMIN — SODIUM CHLORIDE 0.9 % IV PGBK (MB+) [95161]: 4.5 g | INTRAVENOUS | @ 02:00:00 | Stop: 2021-04-04 | NDC 00338915930

## 2021-04-02 MED ADMIN — NOREPINEPHRINE BITARTRATE-NACL 4 MG/250 ML (16 MCG/ML) IV SOLN [173409]: 0.04 ug/kg/min | INTRAVENOUS | @ 04:00:00 | NDC 70004077140

## 2021-04-02 MED ADMIN — IPRATROPIUM-ALBUTEROL 0.5 MG-3 MG(2.5 MG BASE)/3 ML IN NEBU [77459]: 3 mL | RESPIRATORY_TRACT | @ 21:00:00 | NDC 60687040579

## 2021-04-02 MED ADMIN — FUROSEMIDE 10 MG/ML IJ SOLN [3291]: 60 mg | INTRAVENOUS | @ 18:00:00 | Stop: 2021-04-02 | NDC 63323028001

## 2021-04-02 MED ADMIN — LIDOCAINE 5 % TP PTMD [80759]: 2 | TOPICAL | @ 14:00:00 | NDC 00591352530

## 2021-04-02 MED ADMIN — POTASSIUM CHLORIDE IN WATER 10 MEQ/50 ML IV PGBK [11075]: 10 meq | INTRAVENOUS | @ 10:00:00 | Stop: 2021-04-02 | NDC 00338070541

## 2021-04-02 MED ADMIN — FUROSEMIDE 10 MG/ML IJ SOLN [3291]: 40 mg | INTRAVENOUS | @ 14:00:00 | Stop: 2021-04-02 | NDC 70860030242

## 2021-04-03 ENCOUNTER — Inpatient Hospital Stay: Admit: 2021-04-03 | Discharge: 2021-04-03 | Payer: MEDICARE

## 2021-04-03 MED ADMIN — ACETAMINOPHEN 325 MG PO TAB [101]: 650 mg | ORAL | @ 16:00:00 | NDC 00904677361

## 2021-04-03 MED ADMIN — SODIUM CHLORIDE 0.9 % IV PGBK (MB+) [95161]: 4.5 g | INTRAVENOUS | @ 14:00:00 | Stop: 2021-04-04 | NDC 00338055318

## 2021-04-03 MED ADMIN — ATORVASTATIN 40 MG PO TAB [77113]: 40 mg | ORAL | @ 14:00:00 | NDC 00904629261

## 2021-04-03 MED ADMIN — PIPERACILLIN-TAZOBACTAM 4.5 GRAM IV SOLR [80419]: 4.5 g | INTRAVENOUS | @ 14:00:00 | Stop: 2021-04-04 | NDC 60505615900

## 2021-04-03 MED ADMIN — LIDOCAINE 5 % TP PTMD [80759]: 2 | TOPICAL | @ 14:00:00 | NDC 00591352530

## 2021-04-03 MED ADMIN — FUROSEMIDE 10 MG/ML IJ SOLN [3291]: 80 mg | INTRAVENOUS | @ 14:00:00 | NDC 70860030242

## 2021-04-03 MED ADMIN — PIPERACILLIN-TAZOBACTAM 4.5 GRAM IV SOLR [80419]: 4.5 g | INTRAVENOUS | @ 02:00:00 | Stop: 2021-04-04 | NDC 60505615900

## 2021-04-03 MED ADMIN — PIPERACILLIN-TAZOBACTAM 4.5 GRAM IV SOLR [80419]: 4.5 g | INTRAVENOUS | @ 20:00:00 | Stop: 2021-04-04 | NDC 60505615900

## 2021-04-03 MED ADMIN — MIDODRINE 5 MG PO TAB [10610]: 10 mg | ORAL | @ 22:00:00 | NDC 51079045301

## 2021-04-03 MED ADMIN — PANTOPRAZOLE 40 MG IV SOLR [78621]: 40 mg | INTRAVENOUS | @ 01:00:00 | NDC 55150020200

## 2021-04-03 MED ADMIN — SODIUM CHLORIDE 0.9 % IV PGBK (MB+) [95161]: 4.5 g | INTRAVENOUS | @ 20:00:00 | Stop: 2021-04-04 | NDC 00338055318

## 2021-04-03 MED ADMIN — SODIUM CHLORIDE 0.9 % IV PGBK (MB+) [95161]: 4.5 g | INTRAVENOUS | @ 10:00:00 | Stop: 2021-04-04 | NDC 00338055318

## 2021-04-03 MED ADMIN — SODIUM CHLORIDE 0.9 % IV PGBK (MB+) [95161]: 4.5 g | INTRAVENOUS | @ 02:00:00 | Stop: 2021-04-04 | NDC 00338055318

## 2021-04-03 MED ADMIN — ACETAMINOPHEN 325 MG PO TAB [101]: 650 mg | ORAL | @ 03:00:00 | NDC 00904677361

## 2021-04-03 MED ADMIN — PIPERACILLIN-TAZOBACTAM 4.5 GRAM IV SOLR [80419]: 4.5 g | INTRAVENOUS | @ 10:00:00 | Stop: 2021-04-04 | NDC 60505615900

## 2021-04-03 MED ADMIN — FUROSEMIDE 10 MG/ML IJ SOLN [3291]: 80 mg | INTRAVENOUS | @ 01:00:00 | NDC 70860030242

## 2021-04-03 MED ADMIN — CYANOCOBALAMIN (VITAMIN B-12) 500 MCG PO TAB [14673]: 500 ug | ORAL | @ 14:00:00 | NDC 50268085411

## 2021-04-03 MED ADMIN — RP DX TL-201 THALLOUS CHL MCI [210517]: 3.44 | INTRAVENOUS | @ 13:00:00 | Stop: 2021-04-03 | NDC 54029258509

## 2021-04-03 MED ADMIN — POTASSIUM CHLORIDE 20 MEQ PO TBTQ [35943]: 40 meq | ORAL | @ 12:00:00 | Stop: 2021-04-03 | NDC 00904708661

## 2021-04-04 MED ADMIN — LIDOCAINE 5 % TP PTMD [80759]: 2 | TOPICAL | @ 13:00:00 | NDC 00591352530

## 2021-04-04 MED ADMIN — PANTOPRAZOLE 40 MG IV SOLR [78621]: 40 mg | INTRAVENOUS | @ 02:00:00 | NDC 55150020200

## 2021-04-04 MED ADMIN — FUROSEMIDE 10 MG/ML IJ SOLN [3291]: 80 mg | INTRAVENOUS | @ 13:00:00 | NDC 70860030242

## 2021-04-04 MED ADMIN — SODIUM CHLORIDE 0.9 % IV PGBK (MB+) [95161]: 4.5 g | INTRAVENOUS | @ 02:00:00 | Stop: 2021-04-04 | NDC 00338915930

## 2021-04-04 MED ADMIN — MAGNESIUM SULFATE IN D5W 1 GRAM/100 ML IV PGBK [166578]: 1 g | INTRAVENOUS | @ 11:00:00 | Stop: 2021-04-04 | NDC 00409672723

## 2021-04-04 MED ADMIN — POTASSIUM CHLORIDE 10 MEQ PO TBTQ [35942]: 30 meq | ORAL | @ 11:00:00 | Stop: 2021-04-04 | NDC 00245531789

## 2021-04-04 MED ADMIN — POTASSIUM CHLORIDE 20 MEQ PO TBTQ [35943]: 30 meq | ORAL | @ 11:00:00 | Stop: 2021-04-04 | NDC 00904708661

## 2021-04-04 MED ADMIN — MIDODRINE 5 MG PO TAB [10610]: 10 mg | ORAL | @ 22:00:00 | NDC 51079045301

## 2021-04-04 MED ADMIN — ATORVASTATIN 40 MG PO TAB [77113]: 40 mg | ORAL | @ 13:00:00 | NDC 00904629261

## 2021-04-04 MED ADMIN — MIDODRINE 5 MG PO TAB [10610]: 10 mg | ORAL | @ 16:00:00 | NDC 51079045301

## 2021-04-04 MED ADMIN — PIPERACILLIN-TAZOBACTAM 4.5 GRAM IV SOLR [80419]: 4.5 g | INTRAVENOUS | @ 02:00:00 | Stop: 2021-04-04 | NDC 60505615900

## 2021-04-04 MED ADMIN — MIDODRINE 5 MG PO TAB [10610]: 10 mg | ORAL | @ 13:00:00 | NDC 51079045301

## 2021-04-04 MED ADMIN — CYANOCOBALAMIN (VITAMIN B-12) 500 MCG PO TAB [14673]: 500 ug | ORAL | @ 13:00:00 | NDC 50268085411

## 2021-04-04 MED ADMIN — FUROSEMIDE 10 MG/ML IJ SOLN [3291]: 80 mg | INTRAVENOUS | @ 02:00:00 | NDC 70860030242

## 2021-04-05 MED ADMIN — BUMETANIDE 2 MG PO TAB [9311]: 2 mg | ORAL | @ 21:00:00 | NDC 50268013211

## 2021-04-05 MED ADMIN — FUROSEMIDE 10 MG/ML IJ SOLN [3291]: 80 mg | INTRAVENOUS | @ 14:00:00 | Stop: 2021-04-05 | NDC 70860030242

## 2021-04-05 MED ADMIN — MIDODRINE 5 MG PO TAB [10610]: 10 mg | ORAL | @ 14:00:00 | NDC 51079045301

## 2021-04-05 MED ADMIN — FUROSEMIDE 10 MG/ML IJ SOLN [3291]: 80 mg | INTRAVENOUS | @ 03:00:00 | NDC 70860030242

## 2021-04-05 MED ADMIN — ATORVASTATIN 40 MG PO TAB [77113]: 40 mg | ORAL | @ 14:00:00 | NDC 00904629261

## 2021-04-05 MED ADMIN — MIDODRINE 5 MG PO TAB [10610]: 10 mg | ORAL | @ 17:00:00 | NDC 51079045301

## 2021-04-05 MED ADMIN — MIDODRINE 5 MG PO TAB [10610]: 10 mg | ORAL | @ 21:00:00 | NDC 51079045301

## 2021-04-05 MED ADMIN — PANTOPRAZOLE 40 MG IV SOLR [78621]: 40 mg | INTRAVENOUS | @ 03:00:00 | NDC 55150020200

## 2021-04-05 MED ADMIN — CYANOCOBALAMIN (VITAMIN B-12) 500 MCG PO TAB [14673]: 500 ug | ORAL | @ 14:00:00 | NDC 50268085411

## 2021-04-06 ENCOUNTER — Encounter: Admit: 2021-04-06 | Discharge: 2021-04-06 | Payer: MEDICARE

## 2021-04-06 MED ADMIN — BUMETANIDE 2 MG PO TAB [9311]: 2 mg | ORAL | @ 14:00:00 | Stop: 2021-04-07 | NDC 50268013211

## 2021-04-06 MED ADMIN — BUMETANIDE 2 MG PO TAB [9311]: 2 mg | ORAL | @ 22:00:00 | Stop: 2021-04-07 | NDC 50268013211

## 2021-04-06 MED ADMIN — PANTOPRAZOLE 40 MG IV SOLR [78621]: 40 mg | INTRAVENOUS | @ 06:00:00 | Stop: 2021-04-06 | NDC 55150020200

## 2021-04-06 MED ADMIN — LIDOCAINE 5 % TP PTMD [80759]: 1 | TOPICAL | @ 18:00:00 | Stop: 2021-04-07 | NDC 00591352530

## 2021-04-06 MED ADMIN — ATORVASTATIN 40 MG PO TAB [77113]: 40 mg | ORAL | @ 14:00:00 | Stop: 2021-04-07 | NDC 00904629261

## 2021-04-06 MED ADMIN — CYANOCOBALAMIN (VITAMIN B-12) 500 MCG PO TAB [14673]: 500 ug | ORAL | @ 14:00:00 | Stop: 2021-04-07 | NDC 50268085411

## 2021-04-06 MED ADMIN — MIDODRINE 5 MG PO TAB [10610]: 10 mg | ORAL | @ 18:00:00 | Stop: 2021-04-07 | NDC 51079045301

## 2021-04-06 MED ADMIN — MIDODRINE 5 MG PO TAB [10610]: 10 mg | ORAL | @ 14:00:00 | Stop: 2021-04-07 | NDC 51079045301

## 2021-04-06 MED ADMIN — LIDOCAINE 5 % TP PTMD [80759]: 1 | TOPICAL | @ 16:00:00 | Stop: 2021-04-07 | NDC 00591352530

## 2021-04-06 MED ADMIN — MIDODRINE 5 MG PO TAB [10610]: 10 mg | ORAL | @ 14:00:00 | Stop: 2021-04-07 | NDC 00904681861

## 2021-04-06 MED ADMIN — MIDODRINE 5 MG PO TAB [10610]: 10 mg | ORAL | @ 22:00:00 | Stop: 2021-04-07 | NDC 51079045301

## 2021-04-19 DEATH — deceased

## 2021-07-09 ENCOUNTER — Encounter: Admit: 2021-07-09 | Discharge: 2021-07-09 | Payer: MEDICARE

## 2021-07-09 NOTE — Telephone Encounter
Patient passed away 2021/04/22 per daughter Junius Roads. [employer] and MURJ have been updated. Patient identity updated. All future cardiac orders have been canceled.   BC

## 2021-07-20 ENCOUNTER — Encounter: Admit: 2021-07-20 | Discharge: 2021-07-20 | Payer: MEDICARE

## 2021-07-20 NOTE — Progress Notes
CTA chest dated 03/26/2021 sent to PCP on file. Report recommends a follow up mammogram.

## 2021-07-29 ENCOUNTER — Encounter: Admit: 2021-07-29 | Discharge: 2021-07-29 | Payer: MEDICARE

## 2021-07-29 NOTE — Progress Notes
Phone call placed to PCP on file.  LVM on nurse line regarding recommendations made on CTA chest dated 03/26/2021. Requested phone call back to verify receipt of records.

## 2021-08-28 ENCOUNTER — Encounter: Admit: 2021-08-28 | Discharge: 2021-08-28 | Payer: MEDICARE

## 2021-08-28 NOTE — Progress Notes
Phone call placed to PCP on file. LVM regarding imaging recommendations made on CTA Chest dated 03/26/2021.   Final attempt to contact outside provider regarding recommendations.
